# Patient Record
Sex: Female | Born: 1955 | Race: White | Hispanic: No | Marital: Married | Smoking: Former smoker
Health system: Southern US, Community
[De-identification: ages and names within clinical notes are randomized; demographics above are authoritative.]

## PROBLEM LIST (undated history)

## (undated) DIAGNOSIS — E039 Hypothyroidism, unspecified: Secondary | ICD-10-CM

## (undated) DIAGNOSIS — I10 Essential (primary) hypertension: Secondary | ICD-10-CM

## (undated) HISTORY — PX: DENTAL SURGERY: SHX609

---

## 2017-10-05 ENCOUNTER — Emergency Department (HOSPITAL_BASED_OUTPATIENT_CLINIC_OR_DEPARTMENT_OTHER): Payer: 59

## 2017-10-05 ENCOUNTER — Encounter (HOSPITAL_BASED_OUTPATIENT_CLINIC_OR_DEPARTMENT_OTHER): Payer: Self-pay | Admitting: *Deleted

## 2017-10-05 ENCOUNTER — Observation Stay (HOSPITAL_BASED_OUTPATIENT_CLINIC_OR_DEPARTMENT_OTHER)
Admission: EM | Admit: 2017-10-05 | Discharge: 2017-10-06 | Disposition: A | Discharge status: Home / Self Care | Payer: 59 | Attending: Internal Medicine | Admitting: Internal Medicine

## 2017-10-05 DIAGNOSIS — E876 Hypokalemia: Secondary | ICD-10-CM | POA: Diagnosis present

## 2017-10-05 DIAGNOSIS — Z79899 Other long term (current) drug therapy: Secondary | ICD-10-CM | POA: Insufficient documentation

## 2017-10-05 DIAGNOSIS — G459 Transient cerebral ischemic attack, unspecified: Secondary | ICD-10-CM | POA: Diagnosis not present

## 2017-10-05 DIAGNOSIS — I10 Essential (primary) hypertension: Secondary | ICD-10-CM | POA: Insufficient documentation

## 2017-10-05 DIAGNOSIS — E039 Hypothyroidism, unspecified: Secondary | ICD-10-CM | POA: Diagnosis not present

## 2017-10-05 DIAGNOSIS — Z87891 Personal history of nicotine dependence: Secondary | ICD-10-CM | POA: Insufficient documentation

## 2017-10-05 DIAGNOSIS — R42 Dizziness and giddiness: Secondary | ICD-10-CM | POA: Diagnosis present

## 2017-10-05 DIAGNOSIS — H538 Other visual disturbances: Secondary | ICD-10-CM | POA: Diagnosis present

## 2017-10-05 HISTORY — DX: Hypothyroidism, unspecified: E03.9

## 2017-10-05 HISTORY — DX: Essential (primary) hypertension: I10

## 2017-10-05 LAB — CBC WITH DIFFERENTIAL/PLATELET
Basophils Absolute: 0.1 10*3/uL (ref 0.0–0.1)
Basophils Relative: 1 %
Eosinophils Absolute: 0.1 10*3/uL (ref 0.0–0.7)
Eosinophils Relative: 1 %
HEMATOCRIT: 41.2 % (ref 36.0–46.0)
Hemoglobin: 14.1 g/dL (ref 12.0–15.0)
LYMPHS ABS: 1.9 10*3/uL (ref 0.7–4.0)
LYMPHS PCT: 24 %
MCH: 31.5 pg (ref 26.0–34.0)
MCHC: 34.2 g/dL (ref 30.0–36.0)
MCV: 92.2 fL (ref 78.0–100.0)
MONO ABS: 0.5 10*3/uL (ref 0.1–1.0)
MONOS PCT: 7 %
NEUTROS ABS: 5.3 10*3/uL (ref 1.7–7.7)
Neutrophils Relative %: 67 %
Platelets: 287 10*3/uL (ref 150–400)
RBC: 4.47 MIL/uL (ref 3.87–5.11)
RDW: 12.1 % (ref 11.5–15.5)
WBC: 7.9 10*3/uL (ref 4.0–10.5)

## 2017-10-05 LAB — COMPREHENSIVE METABOLIC PANEL
ALT: 11 U/L — ABNORMAL LOW (ref 14–54)
ANION GAP: 9 (ref 5–15)
AST: 19 U/L (ref 15–41)
Albumin: 4.3 g/dL (ref 3.5–5.0)
Alkaline Phosphatase: 100 U/L (ref 38–126)
BILIRUBIN TOTAL: 0.6 mg/dL (ref 0.3–1.2)
BUN: 12 mg/dL (ref 6–20)
CO2: 25 mmol/L (ref 22–32)
Calcium: 9 mg/dL (ref 8.9–10.3)
Chloride: 105 mmol/L (ref 101–111)
Creatinine, Ser: 0.7 mg/dL (ref 0.44–1.00)
GFR calc Af Amer: >60 mL/min (ref >60)
GLUCOSE: 100 mg/dL — AB (ref 65–99)
POTASSIUM: 3.3 mmol/L — AB (ref 3.5–5.1)
Sodium: 139 mmol/L (ref 135–145)
TOTAL PROTEIN: 7.4 g/dL (ref 6.5–8.1)

## 2017-10-05 LAB — TROPONIN I: Troponin I: <0.03 ng/mL (ref <0.03)

## 2017-10-05 LAB — D-DIMER, QUANTITATIVE: D-Dimer, Quant: 0.35 ug/mL-FEU (ref 0.00–0.50)

## 2017-10-05 MED ORDER — IBUPROFEN 800 MG PO TABS
800.0000 mg | ORAL_TABLET | Freq: Once | ORAL | Status: AC
  Administered 2017-10-05: 800 mg via ORAL
  Filled 2017-10-05: qty 1

## 2017-10-05 NOTE — ED Notes (Signed)
Pt laying flat for orthostatic vitals.  

## 2017-10-05 NOTE — ED Notes (Signed)
ED Provider at bedside. 

## 2017-10-05 NOTE — Progress Notes (Addendum)
This is a no charge note  Pending admission per Dr.   Atlee [facility]ansfer [facility] [place]  61 year old-year-old lady with past medical history for hypothyroidism, newly diagnosed hypertension 3 days ago, who presents with blurry vision and dizziness.  Patient has newly diagnosed hypertension 3 days ago. She was started with amlodipine yesterday. Patient had dizziness and 5 minutes of blurry vision while watching TV. Her blurry vision has resolved. Orthostatic vital sign is negative. Patient also had one episode of right shoulder pain and palpitation.  Patient was found to have negative d-dimer, negative troponin, WBC 7.9, potassium 3.3, creatinine normal, temperature normal, mild bradycardia, oxygen saturation 100% on room air. CT head is negative for acute intracranial abnormalities. Clinically patient is suspected to have TIA. Patient is placed on telemetry bed for observation.  Please call manager of [facility] at [phone] when pt arrives to floor   Lorretta HarpXilin Jerolyn Flenniken, MD  [facility] Pager [phone]  If 7PM-7AM, please contact night-coverage [email or web address] Password [email or web address] 10/05/2017, 11:54 PM

## 2017-10-05 NOTE — ED Provider Notes (Signed)
[facility] EMERGENCY DEPARTMENT Provider Note   CSN: [id number] Arrival date & time: 10/05/17  1955     History   Chief Complaint Chief Complaint  Patient presents with  . Dizziness    HPI   Pulse 65, temperature 98.5 F (36.9 C), resp. rate 16, height 5\' 2"  (1.575 m), weight 59 kg (130 lb), SpO2 100 %.  Alexa Moore is a 61 y.o. female complaining of blurred vision for roughly 5 minutes while walking TV watching earlier in the evening.  Symptoms resolved, there was no other symptoms including nausea, vomiting, dysarthria, unilateral weakness, ataxia.  She states that she has been having what she describes as a crick in her right shoulder, has been radiating down to the right arm.  There is been no anterior chest pain associated with this, she sought care for it at urgent care yesterday and it was noted that her blood pressure was high, she was started on 5 mg of Norvasc of which she has had 1 dose.  She was also started on a daily low-dose aspirin.  She is never been diagnosed with high blood pressure in the past, her primary care who used to follow her retired pressure has been borderline in the past.  This patient is right-hand dominant, she did work at [employer] recently.  Notes that she has had some shortness of breath and palpitations over the last several weeks.  She denies any history of DVT/PE, recent immobilizations, calf pain, leg swelling, exogenous estrogen.  She feels generally fatigued and dizzy, the dizziness is not necessarily come with standing.   Past Medical History:  Diagnosis Date  . Hypertension   . Hypothyroidism     Patient Active Problem List   Diagnosis Date Noted  . TIA (transient ischemic attack) 10/05/2017  . Blurry vision 10/05/2017  . Hypokalemia 10/05/2017  . Hypertension   . Hypothyroidism     History reviewed. No pertinent surgical history.  OB History    No data available       Home Medications    Prior  to Admission medications   Medication Sig Start Date End Date Taking? Authorizing Provider  amLODipine (NORVASC) 5 MG tablet Take 5 mg by mouth daily.   Yes [provider]  levothyroxine (SYNTHROID, LEVOTHROID) 125 MCG tablet Take 125 mcg by mouth daily before breakfast.   Yes [provider]    Family History No family history on file.  Social History Social History  Substance Use Topics  . Smoking status: Former Games developer  . Smokeless tobacco: Not on file  . Alcohol use No     Allergies   Patient has no known allergies.   Review of Systems Review of Systems  A complete review of systems was obtained and all systems are negative except as noted in the HPI and PMH.   Physical Exam Updated Vital Signs BP (!) 173/92   Pulse (!) 49   Temp 98.5 F (36.9 C)   Resp 10   Ht 5\' 2"  (1.575 m)   Wt 59 kg (130 lb)   SpO2 98%   BMI 23.78 kg/m   Physical Exam  Constitutional: She is oriented to person, place, and time. She appears well-developed and well-nourished. No distress.  HENT:  Head: Normocephalic and atraumatic.  Mouth/Throat: Oropharynx is clear and moist.  Eyes: Pupils are equal, round, and reactive to light. Conjunctivae and EOM are normal.  No TTP of maxillary or frontal sinuses  No TTP or  induration of temporal arteries bilaterally  Neck: Normal range of motion. Neck supple. No JVD present. No tracheal deviation present.  FROM to C-spine. Pt can touch chin to chest without discomfort. No TTP of midline cervical spine.   Cardiovascular: Normal rate, regular rhythm and intact distal pulses.   Radial pulse equal bilaterally  Pulmonary/Chest: Effort normal and breath sounds normal. No stridor. No respiratory distress. She has no wheezes. She has no rales. She exhibits no tenderness.  Abdominal: Soft. Bowel sounds are normal. She exhibits no distension and no mass. There is no tenderness. There is no rebound and no guarding.  Musculoskeletal: Normal  range of motion. She exhibits no edema or tenderness.  No calf asymmetry, superficial collaterals, palpable cords, edema, Homans sign negative bilaterally.    Neurological: She is alert and oriented to person, place, and time. No cranial nerve deficit.  II-Visual fields grossly intact. III/IV/VI-Extraocular movements intact.  Pupils reactive bilaterally. V/VII-Smile symmetric, equal eyebrow raise,  facial sensation intact VIII- Hearing grossly intact IX/X-Normal gag XI-bilateral shoulder shrug XII-midline tongue extension Motor: 5/5 bilaterally with normal tone and bulk Cerebellar: Normal finger-to-nose  and normal heel-to-shin test.   Romberg negative Ambulates with a coordinated gait   Skin: Skin is warm. She is not diaphoretic.  Psychiatric: She has a normal mood and affect.  Nursing note and vitals reviewed.    ED Treatments / Results  Labs (all labs ordered are listed, but only abnormal results are displayed) Labs Reviewed  COMPREHENSIVE METABOLIC PANEL - Abnormal; Notable for the following:       Result Value   Potassium 3.3 (*)    Glucose, Bld 100 (*)    ALT 11 (*)    All other components within normal limits  CBC WITH DIFFERENTIAL/PLATELET  TROPONIN I  D-DIMER, QUANTITATIVE (NOT AT [facility])    EKG  EKG Interpretation  Date/Time:  Monday October 05 2017 21:48:39 EDT Ventricular Rate:  58 PR Interval:    QRS Duration: 89 QT Interval:  382 QTC Calculation: 376 R Axis:   34 Text Interpretation:  Sinus rhythm RSR' in V1 or V2, probably normal variant Borderline T wave abnormalities No significant change since last tracing Confirmed by Vanetta MuldersZackowski, Scott [id number]) on 10/05/2017 9:51:11 PM       Radiology Ct Head Wo Contrast  Result Date: 10/05/2017 CLINICAL DATA:  61 y/o  F; nausea and vision changes. EXAM: CT HEAD WITHOUT CONTRAST TECHNIQUE: Contiguous axial images were obtained from the base of the skull through the vertex without intravenous contrast. COMPARISON:   None. FINDINGS: Brain: No evidence of acute infarction, hemorrhage, hydrocephalus, extra-axial collection or mass lesion/mass effect. Vascular: Calcific atherosclerosis of carotid siphons. Skull: Normal. Negative for fracture or focal lesion. Sinuses/Orbits: Left maxillary sinus mucous retention cyst. Otherwise negative. Other: None. IMPRESSION: No acute intracranial abnormality identified. Unremarkable CT of the head. Electronically Signed   By: Mitzi HansenLance  Furusawa-Stratton M.D.   On: 10/05/2017 22:35    Procedures Procedures (including critical care time)  Medications Ordered in ED Medications  ibuprofen (ADVIL,MOTRIN) tablet 800 mg (800 mg Oral Given 10/05/17 2227)     Initial Impression / Assessment and Plan / ED Course  I have reviewed the triage vital signs and the nursing notes.  Pertinent labs & imaging results that were available during my care of the patient were reviewed by me and considered in my medical decision making (see chart for details).     Vitals:   10/05/17 2200 10/05/17 2324 10/06/17 0000 10/06/17 0101  BP: (!) 162/87 (!) 157/90 (!) 172/87 (!) 173/92  Pulse: (!) 58 (!) 56 (!) 56 (!) 49  Resp: 20 17 15 10   Temp:      SpO2: 99% 100% 98% 98%  Weight:      Height:        Medications  ibuprofen (ADVIL,MOTRIN) tablet 800 mg (800 mg Oral Given 10/05/17 2227)    Alexa Moore is 61 y.o. female presenting with right posterior shoulder pain radiating down to the right arm she is also had some palpitations and shortness of breath with dizziness.  Physical exam is not consistent with DVT, no risk factors for PE, patient is not tachypneic or tachycardic.  She is a former smoker.  Of more concern she had a blurred vision lasting for approximately 5 minutes while watching TV earlier in the evening.  Concern for TIA given no nonfocal neurologic exam.  EKG with no acute findings, troponin and d-dimer blood work and CT head negative, will need admission for TIA.     Final  Clinical Impressions(s) / ED Diagnoses   Final diagnoses:  Essential hypertension  Hypothyroidism, unspecified type  TIA (transient ischemic attack)    New Prescriptions New Prescriptions   No medications on file     Kaylyn Lim 10/06/17 0147    Vanetta Mulders, MD 10/10/17 [phone]

## 2017-10-05 NOTE — ED Triage Notes (Signed)
Pt c/o dizziness and blurred vision , Dx hypertension x 3 days ago , started on BP medication

## 2017-10-05 NOTE — ED Notes (Signed)
Pt to CT, will lay pt flat and complete orthostatics when she returns

## 2017-10-06 ENCOUNTER — Encounter (HOSPITAL_COMMUNITY): Payer: Self-pay | Admitting: Internal Medicine

## 2017-10-06 ENCOUNTER — Observation Stay (HOSPITAL_BASED_OUTPATIENT_CLINIC_OR_DEPARTMENT_OTHER): Payer: 59

## 2017-10-06 ENCOUNTER — Observation Stay (HOSPITAL_COMMUNITY): Payer: 59

## 2017-10-06 DIAGNOSIS — E876 Hypokalemia: Secondary | ICD-10-CM

## 2017-10-06 DIAGNOSIS — E039 Hypothyroidism, unspecified: Secondary | ICD-10-CM | POA: Diagnosis not present

## 2017-10-06 DIAGNOSIS — I10 Essential (primary) hypertension: Secondary | ICD-10-CM

## 2017-10-06 DIAGNOSIS — H538 Other visual disturbances: Secondary | ICD-10-CM

## 2017-10-06 LAB — LIPID PANEL
Cholesterol: 218 mg/dL — ABNORMAL HIGH (ref 0–200)
HDL: 53 mg/dL (ref >40)
LDL CALC: 138 mg/dL — AB (ref 0–99)
TRIGLYCERIDES: 135 mg/dL (ref <150)
Total CHOL/HDL Ratio: 4.1 RATIO
VLDL: 27 mg/dL (ref 0–40)

## 2017-10-06 LAB — HEMOGLOBIN A1C
Hgb A1c MFr Bld: 5.2 % (ref 4.8–5.6)
Mean Plasma Glucose: 102.54 mg/dL

## 2017-10-06 LAB — ECHOCARDIOGRAM COMPLETE
Height: 62 in
WEIGHTICAEL: 2182.4 [oz_av]

## 2017-10-06 LAB — BASIC METABOLIC PANEL
ANION GAP: 9 (ref 5–15)
BUN: 7 mg/dL (ref 6–20)
CHLORIDE: 106 mmol/L (ref 101–111)
CO2: 26 mmol/L (ref 22–32)
Calcium: 9.3 mg/dL (ref 8.9–10.3)
Creatinine, Ser: 0.75 mg/dL (ref 0.44–1.00)
GFR calc non Af Amer: >60 mL/min (ref >60)
GLUCOSE: 92 mg/dL (ref 65–99)
Potassium: 3.8 mmol/L (ref 3.5–5.1)
Sodium: 141 mmol/L (ref 135–145)

## 2017-10-06 LAB — HIV ANTIBODY (ROUTINE TESTING W REFLEX): HIV SCREEN 4TH GENERATION: NONREACTIVE

## 2017-10-06 LAB — T4, FREE: Free T4: 0.92 ng/dL (ref 0.61–1.12)

## 2017-10-06 LAB — PROTIME-INR
INR: 0.93
Prothrombin Time: 12.4 seconds (ref 11.4–15.2)

## 2017-10-06 LAB — MAGNESIUM: MAGNESIUM: 2.3 mg/dL (ref 1.7–2.4)

## 2017-10-06 LAB — TSH: TSH: 7.591 u[IU]/mL — AB (ref 0.350–4.500)

## 2017-10-06 MED ORDER — POTASSIUM CHLORIDE 20 MEQ/15ML (10%) PO SOLN
20.0000 meq | Freq: Once | ORAL | Status: AC
Start: 2017-10-06 — End: 2017-10-06
  Administered 2017-10-06: 20 meq via ORAL
  Filled 2017-10-06: qty 15

## 2017-10-06 MED ORDER — ACETAMINOPHEN 160 MG/5ML PO SOLN: 650.0000 mg | ORAL | Status: DC | PRN

## 2017-10-06 MED ORDER — SODIUM CHLORIDE 0.9 % IV SOLN
INTRAVENOUS | Status: DC
  Administered 2017-10-06: 04:00:00 via INTRAVENOUS

## 2017-10-06 MED ORDER — HYDRALAZINE HCL 20 MG/ML IJ SOLN: 5.0000 mg | INTRAMUSCULAR | Status: DC | PRN

## 2017-10-06 MED ORDER — ACETAMINOPHEN 650 MG RE SUPP: 650.0000 mg | RECTAL | Status: DC | PRN

## 2017-10-06 MED ORDER — LEVOTHYROXINE SODIUM 100 MCG PO TABS
200.0000 ug | ORAL_TABLET | Freq: Every day | ORAL | Status: DC
  Administered 2017-10-06: 200 ug via ORAL
  Filled 2017-10-06: qty 2

## 2017-10-06 MED ORDER — ZOLPIDEM TARTRATE 5 MG PO TABS: 5.0000 mg | ORAL_TABLET | Freq: Every evening | ORAL | Status: DC | PRN

## 2017-10-06 MED ORDER — SENNOSIDES-DOCUSATE SODIUM 8.6-50 MG PO TABS: 1.0000 | ORAL_TABLET | Freq: Every evening | ORAL | Status: DC | PRN

## 2017-10-06 MED ORDER — AMLODIPINE BESYLATE 10 MG PO TABS
10.0000 mg | ORAL_TABLET | Freq: Every day | ORAL | Status: DC
  Administered 2017-10-06: 10 mg via ORAL
  Filled 2017-10-06: qty 1

## 2017-10-06 MED ORDER — ONDANSETRON HCL 4 MG/2ML IJ SOLN: 4.0000 mg | Freq: Three times a day (TID) | INTRAMUSCULAR | Status: DC | PRN

## 2017-10-06 MED ORDER — ACETAMINOPHEN 325 MG PO TABS
650.0000 mg | ORAL_TABLET | ORAL | Status: DC | PRN
  Administered 2017-10-06: 650 mg via ORAL
  Filled 2017-10-06: qty 2

## 2017-10-06 MED ORDER — AMLODIPINE BESYLATE 5 MG PO TABS: 10.0000 mg | ORAL_TABLET | Freq: Every day | ORAL | 0 refills | Status: DC

## 2017-10-06 MED ORDER — SIMVASTATIN 20 MG PO TABS: 20.0000 mg | ORAL_TABLET | Freq: Every day | ORAL | 0 refills | Status: DC

## 2017-10-06 MED ORDER — ENOXAPARIN SODIUM 40 MG/0.4ML ~~LOC~~ SOLN
40.0000 mg | SUBCUTANEOUS | Status: DC
  Administered 2017-10-06: 40 mg via SUBCUTANEOUS
  Filled 2017-10-06: qty 0.4

## 2017-10-06 MED ORDER — AMLODIPINE BESYLATE 5 MG PO TABS: 5.0000 mg | ORAL_TABLET | Freq: Two times a day (BID) | ORAL | Status: DC

## 2017-10-06 MED ORDER — SIMVASTATIN 20 MG PO TABS: 20.0000 mg | ORAL_TABLET | Freq: Every day | ORAL | Status: DC

## 2017-10-06 MED ORDER — ASPIRIN 325 MG PO TABS: 325.0000 mg | ORAL_TABLET | Freq: Every day | ORAL | 0 refills | Status: DC

## 2017-10-06 MED ORDER — ASPIRIN 300 MG RE SUPP: 300.0000 mg | Freq: Every day | RECTAL | Status: DC

## 2017-10-06 MED ORDER — INFLUENZA VAC SPLIT QUAD 0.5 ML IM SUSY: 0.5000 mL | PREFILLED_SYRINGE | INTRAMUSCULAR | Status: DC

## 2017-10-06 MED ORDER — STROKE: EARLY STAGES OF RECOVERY BOOK
Freq: Once | Status: AC
  Administered 2017-10-06: 05:00:00
  Filled 2017-10-06: qty 1

## 2017-10-06 MED ORDER — AMLODIPINE BESYLATE 5 MG PO TABS: 5.0000 mg | ORAL_TABLET | Freq: Every day | ORAL | Status: DC

## 2017-10-06 MED ORDER — ASPIRIN 325 MG PO TABS
325.0000 mg | ORAL_TABLET | Freq: Every day | ORAL | Status: DC
  Administered 2017-10-06: 325 mg via ORAL
  Filled 2017-10-06: qty 1

## 2017-10-06 NOTE — Discharge Summary (Signed)
Physician Discharge Summary  Brain Alexa Moore [id number] DOB: September 04, 1956 DOA: 10/05/2017  PCP: System, Pcp Not In  Admit date: 10/05/2017 Discharge date: 10/06/2017  Admitted From: Home  Disposition:  Home   Recommendations for Outpatient Follow-up:  1. Follow up with PCP in 1-2 weeks 2. Please obtain BMP/CBC in one week 3. Please follow up on the following pending results: please follow [facility] T 3 results.  4. Needs referral to opthalmology 5. Need BP controlled.      Discharge Condition: stable.  CODE STATUS: Full code.  Diet recommendation: Heart Healthy  Brief/Interim Summary:   1-TIA Vs malignant hypertension;  Patient present with blurry vision, double vision for 5 minutes. Also she had nausea and feeling foggy that day.  MRI negative for stroke.  Discussed with neurology, BP controlled. Continue with aspirin.  LDL 138, started on cholesterol medications.  ECHO normal. Carotid doppler, normal/  HbA1c 5.2  2-HTN; Norvasc increase to 10 mg daily.   3-hyperlipidemia; started on statins.   4-hypothyroidism; TSH elevated. Free t 3 pending. She forget to takes her synthroid on regular bases.  Continue with synthroid.      Discharge Diagnoses:  Principal Problem:   Blurry vision Active Problems:   TIA (transient ischemic attack)   Hypertension   Hypothyroidism   Hypokalemia    Discharge Instructions  Discharge Instructions    Diet - low sodium heart healthy    Complete by:  As directed    Increase activity slowly    Complete by:  As directed      Allergies as of 10/06/2017   No Known Allergies     Medication List    STOP taking these medications   [employer] ASPIRIN ADULT LOW DOSE 81 MG chewable tablet Generic drug:  aspirin Replaced by:  aspirin 325 MG tablet     TAKE these medications   amLODipine 5 MG tablet Commonly known as:  NORVASC Take 2 tablets (10 mg total) by mouth daily. What changed:  how much to take   aspirin 325 MG tablet Take  1 tablet (325 mg total) by mouth daily. Replaces:  [employer] ASPIRIN ADULT LOW DOSE 81 MG chewable tablet   levothyroxine 200 MCG tablet Commonly known as:  SYNTHROID, LEVOTHROID Take 200 mcg by mouth daily.   simvastatin 20 MG tablet Commonly known as:  ZOCOR Take 1 tablet (20 mg total) by mouth daily at 6 PM.       No Known Allergies  Consultations:  Neurology over phone    Procedures/Studies: Ct Head Wo Contrast  Result Date: 10/05/2017 CLINICAL DATA:  61 y/o  F; nausea and vision changes. EXAM: CT HEAD WITHOUT CONTRAST TECHNIQUE: Contiguous axial images were obtained from the base of the skull through the vertex without intravenous contrast. COMPARISON:  None. FINDINGS: Brain: No evidence of acute infarction, hemorrhage, hydrocephalus, extra-axial collection or mass lesion/mass effect. Vascular: Calcific atherosclerosis of carotid siphons. Skull: Normal. Negative for fracture or focal lesion. Sinuses/Orbits: Left maxillary sinus mucous retention cyst. Otherwise negative. Other: None. IMPRESSION: No acute intracranial abnormality identified. Unremarkable CT of the head. Electronically Signed   By: Mitzi HansenLance  Furusawa-Stratton M.D.   On: 10/05/2017 22:35   Mr Brain Wo Contrast  Result Date: 10/06/2017 CLINICAL DATA:  Acute presentation with blurred vision and diplopia 3 days ago. EXAM: MRI HEAD WITHOUT CONTRAST MRA HEAD WITHOUT CONTRAST TECHNIQUE: Multiplanar, multiecho pulse sequences of the brain and surrounding structures were obtained without intravenous contrast. Angiographic images of the head were obtained using MRA technique  without contrast. COMPARISON:  CT 10/05/2017 FINDINGS: MRI HEAD FINDINGS Brain: Diffusion imaging does not show any acute or subacute infarction. The brain has normal appearance without evidence of atrophy, old small or large vessel infarction, mass lesion, hemorrhage, hydrocephalus or extra-axial collection. Vascular: Major vessels at the base of the brain show  flow. Skull and upper cervical spine: Negative. Artifact related to dental work. Sinuses/Orbits: No significant sinus disease. Left maxillary sinus retention cyst. Orbits negative as seen. Other: None MRA HEAD FINDINGS both internal carotid arteries are widely patent into the brain. The anterior and middle cerebral vessels are patent without proximal stenosis, aneurysm or vascular malformation. Fetal origin of both posterior cerebral arteries. Both vertebral arteries are patent to the basilar. No basilar stenosis. Prominent proximal basilar fenestration. Diminutive basilar artery because of the fetal origin of both posterior cerebral arteries. No acquired stenosis. IMPRESSION: Normal MRI of the brain for a person of this age. No evidence of acquired intracranial large or medium vessel disease. 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 Electronically Signed   By: Paulina Fusi M.D.   On: 10/06/2017 08:11   Mr Shirlee Latch ZO Contrast  Result Date: 10/06/2017 CLINICAL DATA:  Acute presentation with blurred vision and diplopia 3 days ago. EXAM: MRI HEAD WITHOUT CONTRAST MRA HEAD WITHOUT CONTRAST TECHNIQUE: Multiplanar, multiecho pulse sequences of the brain and surrounding structures were obtained without intravenous contrast. Angiographic images of the head were obtained using MRA technique without contrast. COMPARISON:  CT 10/05/2017 FINDINGS: MRI HEAD FINDINGS Brain: Diffusion imaging does not show any acute or subacute infarction. The brain has normal appearance without evidence of atrophy, old small or large vessel infarction, mass lesion, hemorrhage, hydrocephalus or extra-axial collection. Vascular: Major vessels at the base of the brain show flow. Skull and upper cervical spine: Negative. Artifact related to dental work. Sinuses/Orbits: No significant sinus disease. Left maxillary sinus  retention cyst. Orbits negative as seen. Other: None MRA HEAD FINDINGS both internal carotid arteries are widely patent into the brain. The anterior and middle cerebral vessels are patent without proximal stenosis, aneurysm or vascular malformation. Fetal origin of both posterior cerebral arteries. Both vertebral arteries are patent to the basilar. No basilar stenosis. Prominent proximal basilar fenestration. Diminutive basilar artery because of the fetal origin of both posterior cerebral arteries. No acquired stenosis. IMPRESSION: Normal MRI of the brain for a person of this age. No evidence of acquired intracranial large or medium vessel disease. 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 Electronically Signed   By: Paulina Fusi M.D.   On: 10/06/2017 08:11      Subjective: She is feeling well, tolerated BP medications.  Denies blurry vision.   Discharge Exam: Vitals:   10/06/17 0625 10/06/17 1427  BP: (!) 175/71 129/72  Pulse: (!) 50 63  Resp: 16 17  Temp: 98.6 F (37 C) 98.5 F (36.9 C)  SpO2: 95% 98%   Vitals:   10/06/17 0330 10/06/17 0530 10/06/17 0625 10/06/17 1427  BP: (!) 174/82 (!) 151/82 (!) 175/71 129/72  Pulse: (!) 52 (!) 52 (!) 50 63  Resp: 16 16 16 17   Temp: 98.6 F (37 C) 98.6 F (37 C) 98.6 F (37 C) 98.5 F (36.9 C)  TempSrc: Oral Oral Oral Oral  SpO2: 99% 94% 95% 98%  Weight: 61.9 kg (136 lb 6.4 oz)  Height: 5\' 2"  (1.575 m)       General: Pt is alert, awake, not in acute distress Cardiovascular: RRR, S1/S2 +, no rubs, no gallops Respiratory: CTA bilaterally, no wheezing, no rhonchi Abdominal: Soft, NT, ND, bowel sounds + Extremities: no edema, no cyanosis    The results of significant diagnostics from this hospitalization (including imaging, microbiology, ancillary and laboratory) are listed below for reference.      Microbiology: No results found for this or any previous visit (from the past 240 hour(s)).   Labs: BNP (last 3 results) No results for input(s): BNP in the last 8760 hours. Basic Metabolic Panel:  Recent Labs Lab 10/05/17 2025 10/06/17 0411 10/06/17 0933  NA 139  --  141  K 3.3*  --  3.8  CL 105  --  106  CO2 25  --  26  GLUCOSE 100*  --  92  BUN 12  --  7  CREATININE 0.70  --  0.75  CALCIUM 9.0  --  9.3  MG  --  2.3  --    Liver Function Tests:  Recent Labs Lab 10/05/17 2025  AST 19  ALT 11*  ALKPHOS 100  BILITOT 0.6  PROT 7.4  ALBUMIN 4.3   No results for input(s): LIPASE, AMYLASE in the last 168 hours. No results for input(s): AMMONIA in the last 168 hours. CBC:  Recent Labs Lab 10/05/17 2025  WBC 7.9  NEUTROABS 5.3  HGB 14.1  HCT 41.2  MCV 92.2  PLT 287   Cardiac Enzymes:  Recent Labs Lab 10/05/17 2025  TROPONINI <0.03   BNP: Invalid input(s): POCBNP CBG: No results for input(s): GLUCAP in the last 168 hours. D-Dimer  Recent Labs  10/05/17 2025  DDIMER 0.35   Hgb A1c  Recent Labs  10/06/17 0411  HGBA1C 5.2   Lipid Profile  Recent Labs  10/06/17 0411  CHOL 218*  HDL 53  LDLCALC 138*  TRIG 135  CHOLHDL 4.1   Thyroid function studies  Recent Labs  10/06/17 0411  TSH 7.591*   Anemia work up No results for input(s): VITAMINB12, FOLATE, FERRITIN, TIBC, IRON, RETICCTPCT in the last 72 hours. Urinalysis No results found for: COLORURINE, APPEARANCEUR, LABSPEC, PHURINE, GLUCOSEU, HGBUR, BILIRUBINUR, KETONESUR, PROTEINUR, UROBILINOGEN, NITRITE, LEUKOCYTESUR Sepsis Labs Invalid input(s): PROCALCITONIN,  WBC,  LACTICIDVEN Microbiology No results found for this or any previous visit (from the past 240 hour(s)).   Time coordinating discharge: Over 30 minutes  SIGNED:   Alba Cory, MD  [facility] 10/06/2017, 5:08 PM Pager   If 7PM-7AM, please contact night-coverage [email or web address] Password [email or web address]

## 2017-10-06 NOTE — Progress Notes (Signed)
OT Screen    10/06/17 1000  OT Visit Information  Last OT Received On 10/06/17  Reason Eval/Treat Not Completed OT screened, no needs identified, will sign off (Pt and husband reporting that pt's blurry vision has resolved and she is at baseline for ADLs and IADLs. Pt reporting pain in R shoulder but states that is no numbness or weakness in her R hand. Discussed reporting pain to MD.)   Curlene Dolphinharis Hailee Hollick MSOT, OTR/L Acute Rehab Pager: [phone] Office: [phone]

## 2017-10-06 NOTE — Progress Notes (Signed)
SLP Cancellation Note  Patient Details Name: Alexa Moore MRN: [id number] DOB: 08-04-1956   Cancelled treatment:       Reason Eval/Treat Not Completed: SLP screened, no needs identified, will sign off   Alexa Moore, Alexa Moore 10/06/2017, 9:36 AM

## 2017-10-06 NOTE — Discharge Instructions (Signed)
Please follow up with PCP in 1 week.  You need to follow up with opthalmology,  Please take BP medication, aspirin and cholesterol medication.

## 2017-10-06 NOTE — Evaluation (Signed)
Physical Therapy Evaluation Patient Details Name: Alexa HiltsKristin Hunley MRN: [id number] DOB: [birth year] Today's Date: 10/06/2017   History of Present Illness  61 y.o. female with medical history significant of hypothyroidism, newly diagnosed hypertension 3 days ago, who presents with blurry vision and double vision  Clinical Impression  Orders received for PT evaluation. Patient demonstrates no significant deficits in functional mobility. Patient independent with all activity and balance. No further skilled PT needs. Will sign off.   Follow Up Recommendations No PT follow up    Equipment Recommendations  None recommended by PT    Recommendations for Other Services       Precautions / Restrictions        Mobility  Bed Mobility Overal bed mobility: Independent                Transfers Overall transfer level: Independent                  Ambulation/Gait Ambulation/Gait assistance: Independent Ambulation Distance (Feet): 440 Feet Assistive device: None Gait Pattern/deviations: WFL(Within Functional Limits)        Stairs            Wheelchair Mobility    Modified Rankin (Stroke Patients Only)       Balance Overall balance assessment: Independent (Independent with Higher level balance)                               Standardized Balance Assessment Standardized Balance Assessment : Dynamic Gait Index   Dynamic Gait Index Level Surface: Normal Change in Gait Speed: Normal Gait with Horizontal Head Turns: Normal Gait with Vertical Head Turns: Normal Gait and Pivot Turn: Normal Step Over Obstacle: Normal Step Around Obstacles: Normal Steps: Normal Total Score: 24       Pertinent Vitals/Pain Pain Assessment: Faces Faces Pain Scale: Hurts little more Pain Location: right shoulder Pain Descriptors / Indicators: Sore Pain Intervention(s): Monitored during session    Home Living Family/patient expects to be discharged to:: Private  residence Living Arrangements: Spouse/significant other Available Help at Discharge: Family Type of Home: House Home Access: Stairs to enter Entrance Stairs-Rails: Can reach both Secretary/administratorntrance Stairs-Number of Steps: 5 Home Layout: Two level Home Equipment: None      Prior Function Level of Independence: Independent               Hand Dominance   Dominant Hand: Right    Extremity/Trunk Assessment   Upper Extremity Assessment Upper Extremity Assessment: Defer to OT evaluation    Lower Extremity Assessment Lower Extremity Assessment: Overall WFL for tasks assessed       Communication   Communication:  (wears glasses all the time)  Cognition Arousal/Alertness: Awake/alert Behavior During Therapy: WFL for tasks assessed/performed Overall Cognitive Status: Within Functional Limits for tasks assessed                                        General Comments      Exercises     Assessment/Plan    PT Assessment Patent does not need any further PT services  PT Problem List         PT Treatment Interventions      PT Goals (Current goals can be found in the Care Plan section)  Acute Rehab PT Goals PT Goal Formulation: All assessment and education complete, DC therapy  Frequency     Barriers to discharge        Co-evaluation               AM-PAC PT "6 Clicks" Daily Activity  Outcome Measure Difficulty turning over in bed (including adjusting bedclothes, sheets and blankets)?: None Difficulty moving from lying on back to sitting on the side of the bed? : None Difficulty sitting down on and standing up from a chair with arms (e.g., wheelchair, bedside commode, etc,.)?: None Help needed moving to and from a bed to chair (including a wheelchair)?: None Help needed walking in hospital room?: None Help needed climbing 3-5 steps with a railing? : None 6 Click Score: 24    End of Session Equipment Utilized During Treatment: Gait  belt Activity Tolerance: Patient tolerated treatment well Patient left: in bed Nurse Communication: Mobility status PT Visit Diagnosis: Other symptoms and signs involving the nervous system (R29.898)    Time: 1610-9604 PT Time Calculation (min) (ACUTE ONLY): 17 min   Charges:   PT Evaluation $PT Eval Low Complexity: 1 Low     PT G Codes:   PT G-Codes **NOT FOR INPATIENT CLASS** Functional Assessment Tool Used: Clinical judgement Functional Limitation: Mobility: Walking and moving around Mobility: Walking and Moving Around Current Status (V4098): 0 percent impaired, limited or restricted Mobility: Walking and Moving Around Goal Status (J1914): 0 percent impaired, limited or restricted Mobility: Walking and Moving Around Discharge Status (N8295): 0 percent impaired, limited or restricted    Charlotte Crumb, PT DPT  Board Certified Neurologic Specialist [phone]   Fabio Asa 10/06/2017, 9:15 AM

## 2017-10-06 NOTE — H&P (Signed)
History and Physical    Alexa Moore ZOX:[id number] DOB: [birth year] DOA: 10/05/2017  Referring MD/NP/PA:   PCP: System, Pcp Not In   Patient coming from:  The patient is coming from home.  At baseline, pt is independent for most of ADL.    Chief Complaint: blurry vision and double vision  HPI: Alexa Moore is a 61 y.o. female with medical history significant of hypothyroidism, newly diagnosed hypertension 3 days ago, who presents with blurry vision and double vision.  Pt states that she had one episode of double vision and blurry vision in both eys while she was watching TV at about 8 PM. The episode lasted for about 5 minutes, then resolved spontaneously. She does not have vision loss or hearing loss. Denies unilateral weakness, numbness or tingliness in extremities. Patient has newly diagnosed hypertension 3 days ago. She was started with amlodipine yesterday. She also had one episode of dizziness which was short lasting. She had one episode of her right shoulder pain and palpitation, but denies chest pain, shortness breath, cough, fever or chills. She has nausea, no vomiting, diarrhea or abdominal pain. Denies symptoms of UTI.  ED Course: pt was found to have negative d-dimer, negative troponin, WBC 7.9, potassium 3.3, creatinine normal, temperature normal, mild bradycardia, oxygen saturation 100% on room air. CT head is negative for acute intracranial abnormalities. Orthostatic vital sign is negative. Patient is placed on telemetry bed for observation.  Review of Systems:   General: no fevers, chills, no body weight gain. HEENT: had blurry vision, no hearing changes or sore throat Respiratory: no dyspnea, coughing, wheezing CV: no chest pain, no palpitations GI: has nausea, no vomiting, abdominal pain, diarrhea, constipation GU: no dysuria, burning on urination, increased urinary frequency, hematuria  Ext: no leg edema Neuro: no unilateral weakness, numbness, or tingling, no  hearing loss. Had blurry vision and double vision. Skin: no rash, no skin tear. MSK: No muscle spasm, no deformity, no limitation of range of movement in spin Heme: No easy bruising.  Travel history: No recent long distant travel.  Allergy: No Known Allergies  Past Medical History:  Diagnosis Date  . Hypertension   . Hypothyroidism     Past Surgical History:  Procedure Laterality Date  . DENTAL SURGERY      Social History:  reports that she quit smoking about 2 years ago. Her smoking use included Cigarettes. She smoked 0.25 packs per day. She has never used smokeless tobacco. She reports that she does not drink alcohol or use drugs.  Family History:  Family History  Problem Relation Age of Onset  . Heart attack Mother   . Heart attack Father   . Hypertension Father   . Hypertension Sister      Prior to Admission medications   Medication Sig Start Date End Date Taking? Authorizing Provider  amLODipine (NORVASC) 5 MG tablet Take 5 mg by mouth daily.   Yes [provider]  levothyroxine (SYNTHROID, LEVOTHROID) 125 MCG tablet Take 125 mcg by mouth daily before breakfast.   Yes [provider]    Physical Exam: Vitals:   10/06/17 0000 10/06/17 0101 10/06/17 0200 10/06/17 0330  BP: (!) 172/87 (!) 173/92 (!) 156/85 (!) 174/82  Pulse: (!) 56 (!) 49 (!) 50 (!) 52  Resp: 15 10 15 16   Temp:    98.6 F (37 C)  TempSrc:    Oral  SpO2: 98% 98% 99% 99%  Weight:    61.9 kg (136 lb 6.4 oz)  Height:  5\' 2"  (1.575 m)   General: Not in acute distress HEENT:       Eyes: PERRL, EOMI, no scleral icterus.       ENT: No discharge from the ears and nose, no pharynx injection, no tonsillar enlargement.        Neck: No JVD, no bruit, no mass felt. Heme: No neck lymph node enlargement. Cardiac: S1/S2, RRR, No murmurs, No gallops or rubs. Respiratory: No rales, wheezing, rhonchi or rubs. GI: Soft, nondistended, nontender, no rebound pain, no organomegaly, BS  present. GU: No hematuria Ext: No pitting leg edema bilaterally. 2+DP/PT pulse bilaterally. Musculoskeletal: No joint deformities, No joint redness or warmth, no limitation of ROM in spin. Skin: No rashes.  Neuro: Alert, oriented X3, cranial nerves II-XII grossly intact, moves all extremities normally. Muscle strength 5/5 in all extremities, sensation to light touch intact. Brachial reflex 2+ bilaterally. Negative Babinski's sign.  Psych: Patient is not psychotic, no suicidal or hemocidal ideation.  Labs on Admission: I have personally reviewed following labs and imaging studies  CBC:  Recent Labs Lab 10/05/17 2025  WBC 7.9  NEUTROABS 5.3  HGB 14.1  HCT 41.2  MCV 92.2  PLT 287   Basic Metabolic Panel:  Recent Labs Lab 10/05/17 2025  NA 139  K 3.3*  CL 105  CO2 25  GLUCOSE 100*  BUN 12  CREATININE 0.70  CALCIUM 9.0   GFR: Estimated Creatinine Clearance: 66.3 mL/min (by C-G formula based on SCr of 0.7 mg/dL). Liver Function Tests:  Recent Labs Lab 10/05/17 2025  AST 19  ALT 11*  ALKPHOS 100  BILITOT 0.6  PROT 7.4  ALBUMIN 4.3   No results for input(s): LIPASE, AMYLASE in the last 168 hours. No results for input(s): AMMONIA in the last 168 hours. Coagulation Profile: No results for input(s): INR, PROTIME in the last 168 hours. Cardiac Enzymes:  Recent Labs Lab 10/05/17 2025  TROPONINI <0.03   BNP (last 3 results) No results for input(s): PROBNP in the last 8760 hours. HbA1C: No results for input(s): HGBA1C in the last 72 hours. CBG: No results for input(s): GLUCAP in the last 168 hours. Lipid Profile: No results for input(s): CHOL, HDL, LDLCALC, TRIG, CHOLHDL, LDLDIRECT in the last 72 hours. Thyroid Function Tests: No results for input(s): TSH, T4TOTAL, FREET4, T3FREE, THYROIDAB in the last 72 hours. Anemia Panel: No results for input(s): VITAMINB12, FOLATE, FERRITIN, TIBC, IRON, RETICCTPCT in the last 72 hours. Urine analysis: No results found  for: COLORURINE, APPEARANCEUR, LABSPEC, PHURINE, GLUCOSEU, HGBUR, BILIRUBINUR, KETONESUR, PROTEINUR, UROBILINOGEN, NITRITE, LEUKOCYTESUR Sepsis Labs: @LABRCNTIP (procalcitonin:4,lacticidven:4) )No results found for this or any previous visit (from the past 240 hour(s)).   Radiological Exams on Admission: Ct Head Wo Contrast  Result Date: 10/05/2017 CLINICAL DATA:  61 y/o  F; nausea and vision changes. EXAM: CT HEAD WITHOUT CONTRAST TECHNIQUE: Contiguous axial images were obtained from the base of the skull through the vertex without intravenous contrast. COMPARISON:  None. FINDINGS: Brain: No evidence of acute infarction, hemorrhage, hydrocephalus, extra-axial collection or mass lesion/mass effect. Vascular: Calcific atherosclerosis of carotid siphons. Skull: Normal. Negative for fracture or focal lesion. Sinuses/Orbits: Left maxillary sinus mucous retention cyst. Otherwise negative. Other: None. IMPRESSION: No acute intracranial abnormality identified. Unremarkable CT of the head. Electronically Signed   By: Mitzi Hansen M.D.   On: 10/05/2017 22:35     EKG: Independently reviewed. Sinus rhythm, QTC 376, T-wave flattening.  Assessment/Plan Principal Problem:   Blurry vision Active Problems:   TIA (transient ischemic  attack)   Hypertension   Hypothyroidism   Hypokalemia   Blurry vision and double vision: Etiology is not clear. Patient had negative orthostatic vital sign in ED. Differential diagnoses include TIA and demyelinating disease. CT head is negative for acute intracranial abnormalities. D-dimer is negative.  - will place to tele bed for obs - Risk factor modification: HgbA1c, fasting lipid panel - MRI, MRA of the brain without contrast  - PT consult, OT consult - Bedside swallowing screen was ordered - 2 d Echocardiogram  - Carotid dopplers  - Aspirin - check UDS - pt/ot  Hypertension: -continue amlodipine -IV hydralazine when necessary  Hypokalemia: K= 3,3  on admission. - Repleted - Check Mg level  Hypothyroidism: Last TSH was not available -Continue home Synthroid -Check TSH   DVT ppx: SQ Lovenox Code Status: Full code Family Communication: None at bed side.  Disposition Plan:  Anticipate discharge back to previous home environment Consults called:  none Admission status: Obs / tele     Date of Service 10/06/2017    Lorretta HarpIU, Estalee Mccandlish [facility] Pager [phone]  If 7PM-7AM, please contact night-coverage [email or web address] Password [email or web address] 10/06/2017, 4:00 AM

## 2017-10-06 NOTE — Progress Notes (Signed)
  Echocardiogram 2D Echocardiogram has been performed.  Alexa SavoyCasey Moore Alexa Moore 10/06/2017, 1:41 PM

## 2017-10-07 LAB — T3, FREE: T3 FREE: 2.1 pg/mL (ref 2.0–4.4)

## 2017-10-07 NOTE — Care Management Note (Signed)
Case Management Note  Patient Details  Name: Alexa Moore MRN: [id number] Date of Birth: 1956-01-16  Subjective/Objective:                    Action/Plan: Pt discharged home with self care. No f/u per PT. Pt has insurance and transportation home.   Expected Discharge Date:  10/06/17               Expected Discharge Plan:  Home/Self Care  In-House Referral:     Discharge planning Services     Post Acute Care Choice:    Choice offered to:     DME Arranged:    DME Agency:     HH Arranged:    HH Agency:     Status of Service:  Completed, signed off  If discussed at [employer] Length of Stay Meetings, dates discussed:    Additional Comments:  Kermit BaloKelli F Marilin Kofman, RN 10/07/2017, 12:40 PM

## 2020-01-09 DIAGNOSIS — U071 COVID-19: Secondary | ICD-10-CM

## 2020-01-09 HISTORY — DX: COVID-19: U07.1

## 2020-01-24 ENCOUNTER — Ambulatory Visit: Payer: 59 | Attending: Internal Medicine

## 2020-01-24 DIAGNOSIS — Z20822 Contact with and (suspected) exposure to covid-19: Secondary | ICD-10-CM

## 2020-01-25 ENCOUNTER — Telehealth: Payer: Self-pay | Admitting: Physician Assistant

## 2020-01-25 ENCOUNTER — Other Ambulatory Visit: Payer: Self-pay | Admitting: Physician Assistant

## 2020-01-25 DIAGNOSIS — G459 Transient cerebral ischemic attack, unspecified: Secondary | ICD-10-CM

## 2020-01-25 DIAGNOSIS — U071 COVID-19: Secondary | ICD-10-CM

## 2020-01-25 DIAGNOSIS — I10 Essential (primary) hypertension: Secondary | ICD-10-CM

## 2020-01-25 LAB — NOVEL CORONAVIRUS, NAA: SARS-CoV-2, NAA: DETECTED — AB

## 2020-01-25 NOTE — Telephone Encounter (Signed)
Called to discuss with Brain Hilts about Covid symptoms and the use of bamlanivimab or casirivimab/imdevimab, a monoclonal antibody infusion for those with mild to moderate Covid symptoms and at a high risk of hospitalization.     Pt is qualified for this infusion at the [facility] infusion center due to co-morbid conditions and/or a member of an at-risk group, however declines infusion at this time. Symptoms tier reviewed as well as criteria for ending isolation.  Symptoms reviewed that would warrant ED/Hospital evaluation. Preventative practices reviewed. Patient verbalized understanding. Patient advised to call back if he decides that he does want to get infusion. Callback number to the infusion center given. Patient advised to go to Urgent care or ED with severe symptoms. Last date pt would be eligible for infusion is 02/02/20. I spoke to her husband who is also positive and they would like to think about it. I have sent them both a MyChart message to review and a number to call back if they want to sign up.   Patient Active Problem List   Diagnosis Date Noted  . TIA (transient ischemic attack) 10/05/2017  . Blurry vision 10/05/2017  . Hypokalemia 10/05/2017  . Hypertension   . Hypothyroidism     Cline Crock PA-C

## 2020-01-25 NOTE — Progress Notes (Signed)
  I connected by phone with Alexa Moore on 01/25/2020 at 8:40 PM to discuss the potential use of an new treatment for mild to moderate COVID-19 viral infection in non-hospitalized patients.  This patient is a 64 y.o. female that meets the FDA criteria for Emergency Use Authorization of bamlanivimab or casirivimab\imdevimab.  Has a (+) direct SARS-CoV-2 viral test result  Has mild or moderate COVID-19   Is ? 62 years of age and weighs ? 40 kg  Is NOT hospitalized due to COVID-19  Is NOT requiring oxygen therapy or requiring an increase in baseline oxygen flow rate due to COVID-19  Is within 10 days of symptom onset  Has at least one of the high risk factor(s) for progression to severe COVID-19 and/or hospitalization as defined in EUA.  Specific high risk criteria : Cardiovascular Disease and HTN    I have spoken and communicated the following to the patient or parent/caregiver:  1. FDA has authorized the emergency use of bamlanivimab and casirivimab\imdevimab for the treatment of mild to moderate COVID-19 in adults and pediatric patients with positive results of direct SARS-CoV-2 viral testing who are 24 years of age and older weighing at least 40 kg, and who are at high risk for progressing to severe COVID-19 and/or hospitalization.  2. The significant known and potential risks and benefits of bamlanivimab and casirivimab\imdevimab, and the extent to which such potential risks and benefits are unknown.  3. Information on available alternative treatments and the risks and benefits of those alternatives, including clinical trials.  4. Patients treated with bamlanivimab and casirivimab\imdevimab should continue to self-isolate and use infection control measures (e.g., wear mask, isolate, social distance, avoid sharing personal items, clean and disinfect "high touch" surfaces, and frequent handwashing) according to CDC guidelines.   5. The patient or parent/caregiver has the option to  accept or refuse bamlanivimab or casirivimab\imdevimab .  After reviewing this information with the patient, The patient agreed to proceed with receiving the bamlanimivab infusion and will be provided a copy of the Fact sheet prior to receiving the infusion.   Sx onset 2/15. Set up for infusion tomorrow 2/18 @ 2:30pm. Directions given.   Cline Crock PA-C 01/25/2020 8:40 PM

## 2020-01-26 ENCOUNTER — Ambulatory Visit (HOSPITAL_COMMUNITY)
Admission: RE | Admit: 2020-01-26 | Discharge: 2020-01-26 | Disposition: A | Discharge status: Home / Self Care | Payer: 59 | Source: Ambulatory Visit | Attending: Pulmonary Disease | Admitting: Pulmonary Disease

## 2020-01-26 DIAGNOSIS — I10 Essential (primary) hypertension: Secondary | ICD-10-CM | POA: Diagnosis present

## 2020-01-26 DIAGNOSIS — U071 COVID-19: Secondary | ICD-10-CM | POA: Insufficient documentation

## 2020-01-26 DIAGNOSIS — G459 Transient cerebral ischemic attack, unspecified: Secondary | ICD-10-CM | POA: Insufficient documentation

## 2020-01-26 MED ORDER — SODIUM CHLORIDE 0.9 % IV SOLN
700.0000 mg | Freq: Once | INTRAVENOUS | Status: AC
  Administered 2020-01-26: 700 mg via INTRAVENOUS
  Filled 2020-01-26: qty 20

## 2020-01-26 MED ORDER — SODIUM CHLORIDE 0.9 % IV SOLN
INTRAVENOUS | Status: DC | PRN
  Administered 2020-01-26: 14:00:00 250 mL via INTRAVENOUS

## 2020-01-26 MED ORDER — DIPHENHYDRAMINE HCL 50 MG/ML IJ SOLN: 50.0000 mg | Freq: Once | INTRAMUSCULAR | Status: DC | PRN

## 2020-01-26 MED ORDER — FAMOTIDINE IN NACL 20-0.9 MG/50ML-% IV SOLN: 20.0000 mg | Freq: Once | INTRAVENOUS | Status: DC | PRN

## 2020-01-26 MED ORDER — EPINEPHRINE 0.3 MG/0.3ML IJ SOAJ: 0.3000 mg | Freq: Once | INTRAMUSCULAR | Status: DC | PRN

## 2020-01-26 MED ORDER — METHYLPREDNISOLONE SODIUM SUCC 125 MG IJ SOLR: 125.0000 mg | Freq: Once | INTRAMUSCULAR | Status: DC | PRN

## 2020-01-26 MED ORDER — ALBUTEROL SULFATE HFA 108 (90 BASE) MCG/ACT IN AERS: 2.0000 | INHALATION_SPRAY | Freq: Once | RESPIRATORY_TRACT | Status: DC | PRN

## 2020-01-26 NOTE — Discharge Instructions (Signed)

## 2020-01-26 NOTE — Progress Notes (Signed)
  Diagnosis: COVID-19  Physician: DR. Delford Field   Procedure: [facility] Infusion Clinic Med: bamlanivimab infusion - Provided patient with bamlanimivab fact sheet for patients, parents and caregivers prior to infusion.  Complications: No immediate complications noted.  Discharge: Discharged home   Alexa Moore 01/26/2020

## 2020-02-03 ENCOUNTER — Ambulatory Visit: Payer: 59 | Attending: Internal Medicine

## 2020-02-03 DIAGNOSIS — Z20822 Contact with and (suspected) exposure to covid-19: Secondary | ICD-10-CM

## 2020-02-04 LAB — NOVEL CORONAVIRUS, NAA: SARS-CoV-2, NAA: DETECTED — AB

## 2020-02-07 ENCOUNTER — Ambulatory Visit: Payer: 59 | Attending: Internal Medicine

## 2020-02-07 DIAGNOSIS — Z20822 Contact with and (suspected) exposure to covid-19: Secondary | ICD-10-CM

## 2020-02-08 LAB — NOVEL CORONAVIRUS, NAA: SARS-CoV-2, NAA: DETECTED — AB

## 2020-02-13 ENCOUNTER — Ambulatory Visit: Payer: 59 | Attending: Internal Medicine

## 2020-02-13 DIAGNOSIS — Z20822 Contact with and (suspected) exposure to covid-19: Secondary | ICD-10-CM

## 2020-02-14 LAB — NOVEL CORONAVIRUS, NAA: SARS-CoV-2, NAA: NOT DETECTED

## 2020-03-15 ENCOUNTER — Telehealth: Payer: Self-pay | Admitting: General Practice

## 2020-03-15 NOTE — Telephone Encounter (Signed)
Appt. Done Thanks.

## 2020-03-15 NOTE — Telephone Encounter (Signed)
Alexa Moore Wife would like to become new patient of yours. Michelle Piper stated he already spoke to you about his wife and you approved. Please Advise.

## 2020-04-01 NOTE — Patient Instructions (Addendum)
It was very nice to meet you today!  I will be in touch with your labs asap Please stop by the lab and then you can see about doing a mammogram or scheduling for later  I will refill your thyroid med once we get your TSH level- would like to make sure no changes need to be made  We will request your records from [facility] for you

## 2020-04-01 NOTE — Progress Notes (Addendum)
[facility] [address] [place], [place] [address] [phone]  Date:  04/04/2020   Name:  Alexa Moore   DOB:  [birth year]   MRN:  [id number]  PCP:  Pearline Cables, MD    Chief Complaint: New Patient (Initial Visit)   History of Present Illness:  Alexa Moore is a 64 y.o. 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  They have 3 children, they are all well. They are 67,67,20. No grands as of yet  Amlodipine- ran out a few weeks ago  Simvastatin Aspirin Levothyroxine- she has a few days left  She had COVID-19 this past February, was treated with monoclonal antibody infusion  Per discharge summary in 2018: 1-TIA Vs malignant hypertension;  Patient present with blurry vision, double vision for 5 minutes. Also she had nausea and feeling foggy that day.  MRI negative for stroke.  Discussed with neurology, BP controlled. Continue with aspirin.  LDL 138, started on cholesterol medications.  ECHO normal. Carotid doppler, normal/  HbA1c 5.2 2-HTN; Norvasc increase to 10 mg daily.  3-hyperlipidemia; started on statins.  4-hypothyroidism; TSH elevated. Free t 3 pending. She forget to takes her synthroid on regular bases.  Continue with synthroid.   Pap: Patient has as per GYN, up-to-date per her report Mammo: needs  Colon: this is UTD Last labs: due, she is fasting today   She does not do a lot of formal exercise but she does try to stay active, works out on occasion She quit smoking a few years ago- she smoked off and on, light smoker over the years Patient Active Problem List   Diagnosis Date Noted  . TIA  (transient ischemic attack) 10/05/2017  . Blurry vision 10/05/2017  . Hypokalemia 10/05/2017  . Hypertension   . Hypothyroidism     Past Medical History:  Diagnosis Date  . COVID-19 01/2020  . Hypertension   . Hypothyroidism     Past Surgical History:  Procedure Laterality Date  . DENTAL SURGERY      Social History   Tobacco Use  . Smoking status: Former Smoker    Packs/day: 0.25    Types: Cigarettes    Quit date: 08/07/2015    Years since quitting: 4.6  . Smokeless tobacco: Never Used  Substance Use Topics  . Alcohol use: No  . Drug use: No    Family History  Problem Relation Age of Onset  . Heart attack Mother   . Heart attack Father   . Hypertension Father   . Hypertension Sister     No Known Allergies  Medication list has been reviewed and updated.  Current Outpatient Medications on File Prior to Visit  Medication Sig Dispense Refill  . levothyroxine (SYNTHROID, LEVOTHROID) 200 MCG tablet Take 200 mcg by mouth daily.     No current facility-administered medications on file prior to visit.    Review of Systems:  As per HPI- otherwise negative.   Physical Examination: Vitals:   04/04/20 0902  BP: (!) 171/84  Pulse: 71  Resp: 16  Temp: 98.6 F (37 C)  SpO2: 97%  Vitals:   04/04/20 0902  Weight: 123 lb (55.8 kg)  Height: 5\' 2"  (1.575 m)   Body mass index is 22.5 kg/m. Ideal Body Weight: Weight in (lb) to have BMI = 25: 136.4  GEN: no acute distress.  Normal weight, petite build.  Looks well HEENT: Atraumatic, Normocephalic.   Bilateral TM wnl, oropharynx normal.  PEERL,EOMI.   Ears and Nose: No external deformity. CV: RRR, No M/G/R. No JVD. No thrill. No extra heart sounds. PULM: CTA B, no wheezes, crackles, rhonchi. No retractions. No resp. distress. No accessory muscle use. ABD: S, NT, ND. No rebound. No HSM. EXTR: No c/c/e PSYCH: Normally interactive. Conversant.    Assessment and Plan: Essential hypertension - Plan: CBC,  Comprehensive metabolic panel, amLODipine (NORVASC) 5 MG tablet, DISCONTINUED: amLODipine (NORVASC) 5 MG tablet  Hyperlipidemia, unspecified hyperlipidemia type - Plan: simvastatin (ZOCOR) 20 MG tablet, Lipid panel  Acquired hypothyroidism - Plan: TSH  Screening mammogram, encounter for - Plan: MM 3D SCREEN BREAST BILATERAL  Encounter for hepatitis C screening test for low risk patient - Plan: Hepatitis C antibody  Screening for diabetes mellitus - Plan: Hemoglobin A1c  Encounter for medical examination to establish care  Here today to establish care.  History of hypertension, hyperlipidemia, hypothyroidism Ordered mammogram Labs pending as above Restart blood pressure medication, her BP is high but she has run out of her medication She also has not been taking simvastatin, I refilled this for her today We will request her outside records from [facility] Moderate medical decision making today  This visit occurred during the SARS-CoV-2 public health emergency.  Safety protocols were in place, including screening questions prior to the visit, additional usage of staff PPE, and extensive cleaning of exam room while observing appropriate contact time as indicated for disinfecting solutions.   Refill thyroid med with TSH  Signed Lamar Blinks, MD  Received her labs, 4/30 Message to patient  Results for orders placed or performed in visit on 04/04/20  CBC  Result Value Ref Range   WBC 4.3 4.0 - 10.5 K/uL   RBC 4.26 3.87 - 5.11 Mil/uL   Platelets 258.0 150.0 - 400.0 K/uL   Hemoglobin 13.2 12.0 - 15.0 g/dL   HCT 39.4 36.0 - 46.0 %   MCV 92.7 78.0 - 100.0 fl   MCHC 33.5 30.0 - 36.0 g/dL   RDW 13.7 11.5 - 15.5 %  Comprehensive metabolic panel  Result Value Ref Range   Sodium 144 135 - 145 mEq/L   Potassium 4.5 3.5 - 5.1 mEq/L   Chloride 109 96 - 112 mEq/L   CO2 29 19 - 32 mEq/L   Glucose, Bld 100 (H) 70 - 99 mg/dL   BUN 14 6 - 23 mg/dL   Creatinine, Ser 0.72 0.40 - 1.20 mg/dL    Total Bilirubin 0.3 0.2 - 1.2 mg/dL   Alkaline Phosphatase 106 39 - 117 U/L   AST 16 0 - 37 U/L   ALT 13 0 - 35 U/L   Total Protein 6.1 6.0 - 8.3 g/dL   Albumin 4.0 3.5 - 5.2 g/dL   GFR 82.26 >60.00 mL/min   Calcium 9.5 8.4 - 10.5 mg/dL  Hemoglobin A1c  Result Value Ref Range   Hgb A1c MFr Bld 5.1 4.6 - 6.5 %  Lipid panel  Result Value Ref Range   Cholesterol 171 0 - 200 mg/dL   Triglycerides 60.0 0.0 - 149.0 mg/dL   HDL 50.20 >39.00 mg/dL   VLDL 12.0 0.0 - 40.0  mg/dL   LDL Cholesterol 924 (H) 0 - 99 mg/dL   Total CHOL/HDL Ratio 3    NonHDL 120.57   TSH  Result Value Ref Range   TSH 0.01 (L) 0.35 - 4.50 uIU/mL  Hepatitis C antibody  Result Value Ref Range   Hepatitis C Ab NON-REACTIVE NON-REACTI   SIGNAL TO CUT-OFF 0.05 <1.00   Thyroid is too high, will decrease to 150 mg with recheck in 1 month

## 2020-04-04 ENCOUNTER — Ambulatory Visit: Payer: 59 | Admitting: Family Medicine

## 2020-04-04 ENCOUNTER — Encounter: Payer: Self-pay | Admitting: Family Medicine

## 2020-04-04 ENCOUNTER — Other Ambulatory Visit: Payer: Self-pay

## 2020-04-04 VITALS — BP 171/84 | HR 71 | Temp 98.6°F | Resp 16 | Ht 62.0 in | Wt 123.0 lb

## 2020-04-04 DIAGNOSIS — Z1159 Encounter for screening for other viral diseases: Secondary | ICD-10-CM

## 2020-04-04 DIAGNOSIS — I10 Essential (primary) hypertension: Secondary | ICD-10-CM

## 2020-04-04 DIAGNOSIS — E039 Hypothyroidism, unspecified: Secondary | ICD-10-CM | POA: Diagnosis not present

## 2020-04-04 DIAGNOSIS — Z131 Encounter for screening for diabetes mellitus: Secondary | ICD-10-CM

## 2020-04-04 DIAGNOSIS — Z0000 Encounter for general adult medical examination without abnormal findings: Secondary | ICD-10-CM

## 2020-04-04 DIAGNOSIS — Z1231 Encounter for screening mammogram for malignant neoplasm of breast: Secondary | ICD-10-CM

## 2020-04-04 DIAGNOSIS — E785 Hyperlipidemia, unspecified: Secondary | ICD-10-CM

## 2020-04-04 LAB — COMPREHENSIVE METABOLIC PANEL
ALT: 13 U/L (ref 0–35)
AST: 16 U/L (ref 0–37)
Albumin: 4 g/dL (ref 3.5–5.2)
Alkaline Phosphatase: 106 U/L (ref 39–117)
BUN: 14 mg/dL (ref 6–23)
CO2: 29 mEq/L (ref 19–32)
Calcium: 9.5 mg/dL (ref 8.4–10.5)
Chloride: 109 mEq/L (ref 96–112)
Creatinine, Ser: 0.72 mg/dL (ref 0.40–1.20)
GFR: 82.26 mL/min (ref >60.00)
Glucose, Bld: 100 mg/dL — ABNORMAL HIGH (ref 70–99)
Potassium: 4.5 mEq/L (ref 3.5–5.1)
Sodium: 144 mEq/L (ref 135–145)
Total Bilirubin: 0.3 mg/dL (ref 0.2–1.2)
Total Protein: 6.1 g/dL (ref 6.0–8.3)

## 2020-04-04 LAB — TSH: TSH: 0.01 u[IU]/mL — ABNORMAL LOW (ref 0.35–4.50)

## 2020-04-04 LAB — LIPID PANEL
Cholesterol: 171 mg/dL (ref 0–200)
HDL: 50.2 mg/dL (ref >39.00)
LDL Cholesterol: 109 mg/dL — ABNORMAL HIGH (ref 0–99)
NonHDL: 120.57
Total CHOL/HDL Ratio: 3
Triglycerides: 60 mg/dL (ref 0.0–149.0)
VLDL: 12 mg/dL (ref 0.0–40.0)

## 2020-04-04 LAB — HEMOGLOBIN A1C: Hgb A1c MFr Bld: 5.1 % (ref 4.6–6.5)

## 2020-04-04 MED ORDER — AMLODIPINE BESYLATE 5 MG PO TABS: 10.0000 mg | ORAL_TABLET | Freq: Every day | ORAL | 3 refills | Status: DC

## 2020-04-04 MED ORDER — SIMVASTATIN 20 MG PO TABS: 20.0000 mg | ORAL_TABLET | Freq: Every day | ORAL | 3 refills | Status: DC

## 2020-04-05 LAB — CBC
HCT: 39.4 % (ref 36.0–46.0)
Hemoglobin: 13.2 g/dL (ref 12.0–15.0)
MCHC: 33.5 g/dL (ref 30.0–36.0)
MCV: 92.7 fl (ref 78.0–100.0)
Platelets: 258 10*3/uL (ref 150.0–400.0)
RBC: 4.26 Mil/uL (ref 3.87–5.11)
RDW: 13.7 % (ref 11.5–15.5)
WBC: 4.3 10*3/uL (ref 4.0–10.5)

## 2020-04-05 LAB — HEPATITIS C ANTIBODY
Hepatitis C Ab: NONREACTIVE
SIGNAL TO CUT-OFF: 0.05 (ref <1.00)

## 2020-04-06 ENCOUNTER — Other Ambulatory Visit: Payer: Self-pay

## 2020-04-06 ENCOUNTER — Encounter: Payer: Self-pay | Admitting: Family Medicine

## 2020-04-06 ENCOUNTER — Ambulatory Visit (HOSPITAL_BASED_OUTPATIENT_CLINIC_OR_DEPARTMENT_OTHER)
Admission: RE | Admit: 2020-04-06 | Discharge: 2020-04-06 | Disposition: A | Discharge status: Home / Self Care | Payer: 59 | Source: Ambulatory Visit | Attending: Family Medicine | Admitting: Family Medicine

## 2020-04-06 DIAGNOSIS — Z1231 Encounter for screening mammogram for malignant neoplasm of breast: Secondary | ICD-10-CM

## 2020-04-06 MED ORDER — LEVOTHYROXINE SODIUM 150 MCG PO TABS: 150.0000 ug | ORAL_TABLET | Freq: Every day | ORAL | 1 refills | Status: DC

## 2020-04-06 NOTE — Addendum Note (Signed)
Addended by: Abbe Amsterdam C on: 04/06/2020 08:40 AM   Modules accepted: Orders

## 2020-06-30 ENCOUNTER — Other Ambulatory Visit: Payer: Self-pay | Admitting: Family Medicine

## 2020-06-30 DIAGNOSIS — E039 Hypothyroidism, unspecified: Secondary | ICD-10-CM

## 2020-11-05 ENCOUNTER — Other Ambulatory Visit: Payer: Self-pay | Admitting: Family Medicine

## 2020-11-05 DIAGNOSIS — I10 Essential (primary) hypertension: Secondary | ICD-10-CM

## 2020-11-05 DIAGNOSIS — E039 Hypothyroidism, unspecified: Secondary | ICD-10-CM

## 2020-12-04 ENCOUNTER — Other Ambulatory Visit: Payer: Self-pay | Admitting: Family Medicine

## 2020-12-04 DIAGNOSIS — I10 Essential (primary) hypertension: Secondary | ICD-10-CM

## 2020-12-08 ENCOUNTER — Other Ambulatory Visit: Payer: Self-pay | Admitting: Family Medicine

## 2020-12-08 DIAGNOSIS — E039 Hypothyroidism, unspecified: Secondary | ICD-10-CM

## 2020-12-11 ENCOUNTER — Encounter: Payer: Self-pay | Admitting: Family Medicine

## 2021-01-04 ENCOUNTER — Other Ambulatory Visit: Payer: Self-pay | Admitting: Family Medicine

## 2021-01-04 DIAGNOSIS — I10 Essential (primary) hypertension: Secondary | ICD-10-CM

## 2021-01-18 NOTE — Patient Instructions (Addendum)
Good to see you again today!  I will be in touch with your labs asap Pap done today I will refill you thyroid med once your TSH comes back just in case we need to make an adjustment  Please check with your GI doctor and make sure your colon cancer screening is up to date  Assuming all is well we can plan to visit in 6 months    Health Maintenance, Female Adopting a healthy lifestyle and getting preventive care are important in promoting health and wellness. Ask your health care provider about:  The right schedule for you to have regular tests and exams.  Things you can do on your own to prevent diseases and keep yourself healthy. What should I know about diet, weight, and exercise? Eat a healthy diet  Eat a diet that includes plenty of vegetables, fruits, low-fat dairy products, and lean protein.  Do not eat a lot of foods that are high in solid fats, added sugars, or sodium.   Maintain a healthy weight Body mass index (BMI) is used to identify weight problems. It estimates body fat based on height and weight. Your health care provider can help determine your BMI and help you achieve or maintain a healthy weight. Get regular exercise Get regular exercise. This is one of the most important things you can do for your health. Most adults should:  Exercise for at least 150 minutes each week. The exercise should increase your heart rate and make you sweat (moderate-intensity exercise).  Do strengthening exercises at least twice a week. This is in addition to the moderate-intensity exercise.  Spend less time sitting. Even light physical activity can be beneficial. Watch cholesterol and blood lipids Have your blood tested for lipids and cholesterol at 63 years of age, then have this test every 5 years. Have your cholesterol levels checked more often if:  Your lipid or cholesterol levels are high.  You are older than 62 years of age.  You are at high risk for heart disease. What should  I know about cancer screening? Depending on your health history and family history, you may need to have cancer screening at various ages. This may include screening for:  Breast cancer.  Cervical cancer.  Colorectal cancer.  Skin cancer.  Lung cancer. What should I know about heart disease, diabetes, and high blood pressure? Blood pressure and heart disease  High blood pressure causes heart disease and increases the risk of stroke. This is more likely to develop in people who have high blood pressure readings, are of African descent, or are overweight.  Have your blood pressure checked: ? Every 3-5 years if you are 97-86 years of age. ? Every year if you are 59 years old or older. Diabetes Have regular diabetes screenings. This checks your fasting blood sugar level. Have the screening done:  Once every three years after age 72 if you are at a normal weight and have a low risk for diabetes.  More often and at a younger age if you are overweight or have a high risk for diabetes. What should I know about preventing infection? Hepatitis B If you have a higher risk for hepatitis B, you should be screened for this virus. Talk with your health care provider to find out if you are at risk for hepatitis B infection. Hepatitis C Testing is recommended for:  Everyone born from [birth year] through 1963.  Anyone with known risk factors for hepatitis C. Sexually transmitted infections (STIs)  Get  screened for STIs, including gonorrhea and chlamydia, if: ? You are sexually active and are younger than 63 years of age. ? You are older than 63 years of age and your health care provider tells you that you are at risk for this type of infection. ? Your sexual activity has changed since you were last screened, and you are at increased risk for chlamydia or gonorrhea. Ask your health care provider if you are at risk.  Ask your health care provider about whether you are at high risk for HIV. Your health  care provider may recommend a prescription medicine to help prevent HIV infection. If you choose to take medicine to prevent HIV, you should first get tested for HIV. You should then be tested every 3 months for as long as you are taking the medicine. Pregnancy  If you are about to stop having your period (premenopausal) and you may become pregnant, seek counseling before you get pregnant.  Take 400 to 800 micrograms (mcg) of folic acid every day if you become pregnant.  Ask for birth control (contraception) if you want to prevent pregnancy. Osteoporosis and menopause Osteoporosis is a disease in which the bones lose minerals and strength with aging. This can result in bone fractures. If you are 39 years old or older, or if you are at risk for osteoporosis and fractures, ask your health care provider if you should:  Be screened for bone loss.  Take a calcium or vitamin D supplement to lower your risk of fractures.  Be given hormone replacement therapy (HRT) to treat symptoms of menopause. Follow these instructions at home: Lifestyle  Do not use any products that contain nicotine or tobacco, such as cigarettes, e-cigarettes, and chewing tobacco. If you need help quitting, ask your health care provider.  Do not use street drugs.  Do not share needles.  Ask your health care provider for help if you need support or information about quitting drugs. Alcohol use  Do not drink alcohol if: ? Your health care provider tells you not to drink. ? You are pregnant, may be pregnant, or are planning to become pregnant.  If you drink alcohol: ? Limit how much you use to 0-1 drink a day. ? Limit intake if you are breastfeeding.  Be aware of how much alcohol is in your drink. In the [place] one drink equals one 12 oz bottle of beer (355 mL), one 5 oz glass of wine (148 mL), or one 1 oz glass of hard liquor (44 mL). General instructions  Schedule regular health, dental, and eye exams.  Stay  current with your vaccines.  Tell your health care provider if: ? You often feel depressed. ? You have ever been abused or do not feel safe at home. Summary  Adopting a healthy lifestyle and getting preventive care are important in promoting health and wellness.  Follow your health care provider's instructions about healthy diet, exercising, and getting tested or screened for diseases.  Follow your health care provider's instructions on monitoring your cholesterol and blood pressure. This information is not intended to replace advice given to you by your health care provider. Make sure you discuss any questions you have with your health care provider. Document Revised: 11/17/2018 Document Reviewed: 11/17/2018 Elsevier Patient Education  2021 [employer].

## 2021-01-18 NOTE — Progress Notes (Addendum)
[facility] [address], [address] [place], [place] [address] [phone]  Date:  01/21/2021   Name:  Alexa Moore   DOB:  1956/11/03   MRN:  [id number]  PCP:  Pearline Cables, MD    Chief Complaint: Medication Refill   History of Present Illness:  Alexa Moore is a 65 y.o. very pleasant female patient who presents with the following:  Medication discussion today Last seen by myself in April  History of TIA in 2018, hypertension, hypothyroidism She works for a Facilities manager, admin side  She is from [place] originally, but has lived in this area for 30 years  She is married to Alexa Moore.  They have 3 children all in their 30s. No grands as of yet  covid series- she did a J and J dose- encouraged her to get a booster.  She did have covid in December Tetanus- will give a booster today Flu today Pap- last done approx 5 years ago.  She did have an abnormal in her youth, all ok in recent years.  Update today  Colon cancer screening- pt does have GI care, she is not quite sure but she does have a GI doctor in [place] She will check on this   Last tsh was low- we adjusted thyroid replacement and will recheck today   Amlodipine Levothyroxine Simvastatin- she has not been taking, just forgets.  She is willing to use this again No PMB  Lab Results  Component Value Date   TSH 0.01 (L) 04/04/2020     Patient Active Problem List   Diagnosis Date Noted  . TIA (transient ischemic attack) 10/05/2017  . Blurry vision 10/05/2017  . Hypokalemia 10/05/2017  . Hypertension   . Hypothyroidism     Past Medical History:  Diagnosis Date  . COVID-19 01/2020  . Hypertension   . Hypothyroidism     Past Surgical History:  Procedure Laterality Date  . DENTAL SURGERY      Social History   Tobacco Use  . Smoking status: Former Smoker    Packs/day: 0.25    Types: Cigarettes    Quit date: 08/07/2015    Years since quitting: 5.4  .  Smokeless tobacco: Never Used  Substance Use Topics  . Alcohol use: No  . Drug use: No    Family History  Problem Relation Age of Onset  . Heart attack Mother   . Heart attack Father   . Hypertension Father   . Hypertension Sister     No Known Allergies  Medication list has been reviewed and updated.  Current Outpatient Medications on File Prior to Visit  Medication Sig Dispense Refill  . levothyroxine (SYNTHROID) 150 MCG tablet Take 1 tablet (150 mcg total) by mouth daily before breakfast. 30 tablet 0   No current facility-administered medications on file prior to visit.    Review of Systems:  As per HPI- otherwise negative.   Physical Examination: Vitals:   01/21/21 1039  BP: (!) 144/82  Pulse: (!) 56  Resp: 16  SpO2: 99%   Vitals:   01/21/21 1039  Weight: 132 lb (59.9 kg)  Height: 5\' 2"  (1.575 m)   Body mass index is 24.14 kg/m. Ideal Body Weight: Weight in (lb) to have BMI = 25: 136.4  GEN: no acute distress. Normal weight, looks well  HEENT: Atraumatic, Normocephalic.  Bilateral TM wnl,PEERL,EOMI.   Ears and Nose: No external deformity. CV: RRR, No M/G/R.  No JVD. No thrill. No extra heart sounds. PULM: CTA B, no wheezes, crackles, rhonchi. No retractions. No resp. distress. No accessory muscle use. ABD: S, NT, ND. No rebound. No HSM. EXTR: No c/c/e PSYCH: Normally interactive. Conversant.  Normal vulva and vagina, normal cervix on visual exam    BP Readings from Last 3 Encounters:  01/21/21 (!) 144/82  04/04/20 (!) 171/84  01/26/20 128/71    Assessment and Plan: Acquired hypothyroidism - Plan: TSH  Essential hypertension - Plan: CBC, Comprehensive metabolic panel, amLODipine (NORVASC) 10 MG tablet  Hyperlipidemia, unspecified hyperlipidemia type - Plan: Lipid panel, simvastatin (ZOCOR) 20 MG tablet  Screening for diabetes mellitus - Plan: Hemoglobin A1c  Fatigue, unspecified type - Plan: TSH, VITAMIN D 25 Hydroxy (Vit-D Deficiency,  Fractures)  Immunization due - Plan: Tdap vaccine greater than or equal to 7yo IM  Screening for cervical cancer - Plan: Cytology - PAP  Following up today- Will plan further follow- up pending labs. Her BP is slightly elevated- asked her to check on this 2-3x a week over the next month and update me.  Can adjust medication if needed She may get some minor LE edema on amlodipine 10 but not enough to bother her Pap done Tdap, flu today   Refill thyroid med once TSH comes in  This visit occurred during the SARS-CoV-2 public health emergency.  Safety protocols were in place, including screening questions prior to the visit, additional usage of staff PPE, and extensive cleaning of exam room while observing appropriate contact time as indicated for disinfecting solutions.    Signed Abbe Amsterdam, MD  Received her labs as below, message to pt  Results for orders placed or performed in visit on 01/21/21  CBC  Result Value Ref Range   WBC 5.2 4.0 - 10.5 K/uL   RBC 4.62 3.87 - 5.11 Mil/uL   Platelets 328.0 150.0 - 400.0 K/uL   Hemoglobin 14.6 12.0 - 15.0 g/dL   HCT 73.4 28.7 - 68.1 %   MCV 91.6 78.0 - 100.0 fl   MCHC 34.6 30.0 - 36.0 g/dL   RDW 15.7 26.2 - 03.5 %  Comprehensive metabolic panel  Result Value Ref Range   Sodium 143 135 - 145 mEq/L   Potassium 4.4 3.5 - 5.1 mEq/L   Chloride 106 96 - 112 mEq/L   CO2 28 19 - 32 mEq/L   Glucose, Bld 94 70 - 99 mg/dL   BUN 14 6 - 23 mg/dL   Creatinine, Ser 5.97 0.40 - 1.20 mg/dL   Total Bilirubin 0.3 0.2 - 1.2 mg/dL   Alkaline Phosphatase 130 (H) 39 - 117 U/L   AST 13 0 - 37 U/L   ALT 12 0 - 35 U/L   Total Protein 7.0 6.0 - 8.3 g/dL   Albumin 4.3 3.5 - 5.2 g/dL   GFR 41.63 >84.53 mL/min   Calcium 10.0 8.4 - 10.5 mg/dL  Hemoglobin M4W  Result Value Ref Range   Hgb A1c MFr Bld 5.2 4.6 - 6.5 %  Lipid panel  Result Value Ref Range   Cholesterol 185 0 - 200 mg/dL   Triglycerides 80.3 0.0 - 149.0 mg/dL   HDL 21.22 >48.25 mg/dL    VLDL 00.3 0.0 - 70.4 mg/dL   LDL Cholesterol 888 (H) 0 - 99 mg/dL   Total CHOL/HDL Ratio 3    NonHDL 129.54   TSH  Result Value Ref Range   TSH 0.03 (L) 0.35 - 4.50 uIU/mL  VITAMIN D 25 Hydroxy (  Vit-D Deficiency, Fractures)  Result Value Ref Range   VITD 22.86 (L) 30.00 - 100.00 ng/mL

## 2021-01-21 ENCOUNTER — Encounter: Payer: Self-pay | Admitting: Family Medicine

## 2021-01-21 ENCOUNTER — Ambulatory Visit: Payer: 59 | Admitting: Family Medicine

## 2021-01-21 ENCOUNTER — Other Ambulatory Visit (HOSPITAL_COMMUNITY)
Admission: RE | Admit: 2021-01-21 | Discharge: 2021-01-21 | Disposition: A | Discharge status: Home / Self Care | Payer: 59 | Source: Ambulatory Visit | Attending: Family Medicine | Admitting: Family Medicine

## 2021-01-21 ENCOUNTER — Other Ambulatory Visit: Payer: Self-pay

## 2021-01-21 VITALS — BP 144/82 | HR 56 | Resp 16 | Ht 62.0 in | Wt 132.0 lb

## 2021-01-21 DIAGNOSIS — E559 Vitamin D deficiency, unspecified: Secondary | ICD-10-CM

## 2021-01-21 DIAGNOSIS — E039 Hypothyroidism, unspecified: Secondary | ICD-10-CM | POA: Diagnosis not present

## 2021-01-21 DIAGNOSIS — Z23 Encounter for immunization: Secondary | ICD-10-CM | POA: Diagnosis not present

## 2021-01-21 DIAGNOSIS — Z124 Encounter for screening for malignant neoplasm of cervix: Secondary | ICD-10-CM

## 2021-01-21 DIAGNOSIS — R5383 Other fatigue: Secondary | ICD-10-CM | POA: Diagnosis not present

## 2021-01-21 DIAGNOSIS — I10 Essential (primary) hypertension: Secondary | ICD-10-CM | POA: Diagnosis not present

## 2021-01-21 DIAGNOSIS — R748 Abnormal levels of other serum enzymes: Secondary | ICD-10-CM

## 2021-01-21 DIAGNOSIS — Z131 Encounter for screening for diabetes mellitus: Secondary | ICD-10-CM

## 2021-01-21 DIAGNOSIS — E785 Hyperlipidemia, unspecified: Secondary | ICD-10-CM | POA: Diagnosis not present

## 2021-01-21 LAB — COMPREHENSIVE METABOLIC PANEL
ALT: 12 U/L (ref 0–35)
AST: 13 U/L (ref 0–37)
Albumin: 4.3 g/dL (ref 3.5–5.2)
Alkaline Phosphatase: 130 U/L — ABNORMAL HIGH (ref 39–117)
BUN: 14 mg/dL (ref 6–23)
CO2: 28 mEq/L (ref 19–32)
Calcium: 10 mg/dL (ref 8.4–10.5)
Chloride: 106 mEq/L (ref 96–112)
Creatinine, Ser: 0.74 mg/dL (ref 0.40–1.20)
GFR: 86.89 mL/min (ref >60.00)
Glucose, Bld: 94 mg/dL (ref 70–99)
Potassium: 4.4 mEq/L (ref 3.5–5.1)
Sodium: 143 mEq/L (ref 135–145)
Total Bilirubin: 0.3 mg/dL (ref 0.2–1.2)
Total Protein: 7 g/dL (ref 6.0–8.3)

## 2021-01-21 LAB — LIPID PANEL
Cholesterol: 185 mg/dL (ref 0–200)
HDL: 55.6 mg/dL (ref >39.00)
LDL Cholesterol: 113 mg/dL — ABNORMAL HIGH (ref 0–99)
NonHDL: 129.54
Total CHOL/HDL Ratio: 3
Triglycerides: 83 mg/dL (ref 0.0–149.0)
VLDL: 16.6 mg/dL (ref 0.0–40.0)

## 2021-01-21 LAB — CBC
HCT: 42.3 % (ref 36.0–46.0)
Hemoglobin: 14.6 g/dL (ref 12.0–15.0)
MCHC: 34.6 g/dL (ref 30.0–36.0)
MCV: 91.6 fl (ref 78.0–100.0)
Platelets: 328 10*3/uL (ref 150.0–400.0)
RBC: 4.62 Mil/uL (ref 3.87–5.11)
RDW: 12.2 % (ref 11.5–15.5)
WBC: 5.2 10*3/uL (ref 4.0–10.5)

## 2021-01-21 LAB — VITAMIN D 25 HYDROXY (VIT D DEFICIENCY, FRACTURES): VITD: 22.86 ng/mL — ABNORMAL LOW (ref 30.00–100.00)

## 2021-01-21 LAB — TSH: TSH: 0.03 u[IU]/mL — ABNORMAL LOW (ref 0.35–4.50)

## 2021-01-21 LAB — HEMOGLOBIN A1C: Hgb A1c MFr Bld: 5.2 % (ref 4.6–6.5)

## 2021-01-21 MED ORDER — SIMVASTATIN 20 MG PO TABS: 20.0000 mg | ORAL_TABLET | Freq: Every day | ORAL | 3 refills | Status: AC

## 2021-01-21 MED ORDER — AMLODIPINE BESYLATE 10 MG PO TABS: 10.0000 mg | ORAL_TABLET | Freq: Every day | ORAL | 3 refills | Status: DC

## 2021-01-22 ENCOUNTER — Encounter: Payer: Self-pay | Admitting: Family Medicine

## 2021-01-22 LAB — CYTOLOGY - PAP
Comment: NEGATIVE
Diagnosis: NEGATIVE
High risk HPV: NEGATIVE

## 2021-01-22 MED ORDER — VITAMIN D3 1.25 MG (50000 UT) PO CAPS: ORAL_CAPSULE | ORAL | 0 refills | Status: DC

## 2021-01-22 MED ORDER — LEVOTHYROXINE SODIUM 125 MCG PO TABS: 125.0000 ug | ORAL_TABLET | Freq: Every day | ORAL | 3 refills | Status: DC

## 2021-01-22 NOTE — Addendum Note (Signed)
Addended by: Abbe Amsterdam C on: 01/22/2021 12:58 PM   Modules accepted: Orders

## 2021-05-29 ENCOUNTER — Other Ambulatory Visit: Payer: Self-pay | Admitting: Family Medicine

## 2021-05-29 DIAGNOSIS — E039 Hypothyroidism, unspecified: Secondary | ICD-10-CM

## 2021-07-15 NOTE — Progress Notes (Addendum)
[name] [address] [place], [place] [address] [phone]  Date:  07/22/2021   Name:  Alexa Moore   DOB:  07/12/1956   MRN:  [id number]  PCP:  Alexa Mclean, MD    Chief Complaint: Follow-up (6 month) and Edema (Ankles and feet , believes its a bp medication side effect)   History of Present Illness:  Alexa Moore is a 66 y.o. very pleasant female patient who presents with the following:  Patient seen today for 55-monthfollow-up visit Most recent visit with myself was [place] her TSH is quite suppressed, I made a levothyroxine dose change.  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 I encouraged her to do this   She does get some ankle swelling intermittently - she may notice it more han 50% of days. This is worse when it is hot out  She wonders if this may be due to her amlodipine, certainly this may be the culprit.  We will try changing her to a different medication  She may also feel lightheaded if she stands up quickly  She does check her BP at home any may notice readings of 140/80s on average   No chest pain or palpitations Weight is stable, she had an echo in 2018 which showed normal EF  Wt Readings from Last 3 Encounters:  07/22/21 128 lb 9.6 oz (58.3 kg)  01/21/21 132 lb (59.9 kg)  04/04/20 123 lb  (55.8 kg)     Lab Results  Component Value Date   TSH 0.03 (L) 01/21/2021     Patient Active Problem List   Diagnosis Date Noted   TIA (transient ischemic attack) 10/05/2017   Blurry vision 10/05/2017   Hypokalemia 10/05/2017   Hypertension    Hypothyroidism     Past Medical History:  Diagnosis Date   COVID-19 01/2020   Hypertension    Hypothyroidism     Past Surgical History:  Procedure Laterality Date   DENTAL SURGERY      Social History   Tobacco Use   Smoking status: Former    Packs/day: 0.25    Types: Cigarettes    Quit date: 08/07/2015    Years since quitting: 5.9   Smokeless tobacco: Never  Substance Use Topics   Alcohol use: No   Drug use: No    Family History  Problem Relation Age of Onset   Heart attack Mother    Heart attack Father    Hypertension Father    Hypertension Sister     No Known Allergies  Medication list has been reviewed and updated.  Current Outpatient Medications on File Prior to Visit  Medication Sig Dispense Refill   Cholecalciferol (VITAMIN D3) 1.25 MG (50000 UT) CAPS Take 1 weekly for 12 weeks 12 capsule  0   levothyroxine (SYNTHROID) 125 MCG tablet TAKE 1 TABLET BY MOUTH DAILY BEFORE BREAKFAST. 30 tablet 0   simvastatin (ZOCOR) 20 MG tablet Take 1 tablet (20 mg total) by mouth daily at 6 PM. 90 tablet 3   No current facility-administered medications on file prior to visit.    Review of Systems:  As per HPI- otherwise negative.   Physical Examination: Vitals:   07/22/21 0904  BP: 130/64  Pulse: 67  Resp: 18  Temp: 97.8 F (36.6 C)  SpO2: 100%   Vitals:   07/22/21 0904  Weight: 128 lb 9.6 oz (58.3 kg)  Height: 5' 2"  (1.575 m)   Body mass index is 23.52 kg/m. Ideal Body Weight: Weight in (lb) to have BMI = 25: 136.4  GEN: no acute distress. Normal weight, looks well  HEENT: Atraumatic, Normocephalic.  Ears and Nose: No external deformity. CV: RRR, No M/G/R. No JVD. No thrill. No extra heart  sounds. PULM: CTA B, no wheezes, crackles, rhonchi. No retractions. No resp. distress. No accessory muscle use. ABD: S, NT, ND, +BS. No rebound. No HSM. EXTR: No c/c/e PSYCH: Normally interactive. Conversant.  No ankle edema at this time  Assessment and Plan: Elevated alkaline phosphatase level - Plan: Comprehensive metabolic panel  Vitamin D deficiency - Plan: VITAMIN D 25 Hydroxy (Vit-D Deficiency, Fractures)  Acquired hypothyroidism - Plan: TSH  Hyperlipidemia, unspecified hyperlipidemia type  Essential hypertension - Plan: losartan (COZAAR) 50 MG tablet  Lower extremity edema  Patient seen today for follow-up.  She notes lower extremity edema which may be related to her amlodipine.  We will have her change her amlodipine to losartan 50-I gave her instructions for making this change and monitoring blood pressure after change  Encouraged needed immunizations  Will plan further follow- up pending labs.  This visit occurred during the SARS-CoV-2 public health emergency.  Safety protocols were in place, including screening questions prior to the visit, additional usage of staff PPE, and extensive cleaning of exam room while observing appropriate contact time as indicated for disinfecting solutions.   Signed Alexa Blinks, MD   Received labs as below, message to patient  Results for orders placed or performed in visit on 07/22/21  TSH  Result Value Ref Range   TSH 0.21 (L) 0.35 - 5.50 uIU/mL  Comprehensive metabolic panel  Result Value Ref Range   Sodium 142 135 - 145 mEq/L   Potassium 4.2 3.5 - 5.1 mEq/L   Chloride 105 96 - 112 mEq/L   CO2 28 19 - 32 mEq/L   Glucose, Bld 95 70 - 99 mg/dL   BUN 16 6 - 23 mg/dL   Creatinine, Ser 0.79 0.40 - 1.20 mg/dL   Total Bilirubin 0.4 0.2 - 1.2 mg/dL   Alkaline Phosphatase 116 39 - 117 U/L   AST 14 0 - 37 U/L   ALT 10 0 - 35 U/L   Total Protein 6.7 6.0 - 8.3 g/dL   Albumin 4.5 3.5 - 5.2 g/dL   GFR 80.05 >60.00 mL/min    Calcium 9.9 8.4 - 10.5 mg/dL  VITAMIN D 25 Hydroxy (Vit-D Deficiency, Fractures)  Result Value Ref Range   VITD 35.83 30.00 - 100.00 ng/mL

## 2021-07-18 ENCOUNTER — Other Ambulatory Visit: Payer: Self-pay | Admitting: Family Medicine

## 2021-07-18 DIAGNOSIS — E039 Hypothyroidism, unspecified: Secondary | ICD-10-CM

## 2021-07-22 ENCOUNTER — Encounter: Payer: Self-pay | Admitting: Family Medicine

## 2021-07-22 ENCOUNTER — Other Ambulatory Visit: Payer: Self-pay

## 2021-07-22 ENCOUNTER — Ambulatory Visit: Payer: BC Managed Care – PPO | Admitting: Family Medicine

## 2021-07-22 VITALS — BP 130/64 | HR 67 | Temp 97.8°F | Resp 18 | Ht 62.0 in | Wt 128.6 lb

## 2021-07-22 DIAGNOSIS — E039 Hypothyroidism, unspecified: Secondary | ICD-10-CM

## 2021-07-22 DIAGNOSIS — R748 Abnormal levels of other serum enzymes: Secondary | ICD-10-CM | POA: Diagnosis not present

## 2021-07-22 DIAGNOSIS — E785 Hyperlipidemia, unspecified: Secondary | ICD-10-CM | POA: Diagnosis not present

## 2021-07-22 DIAGNOSIS — I10 Essential (primary) hypertension: Secondary | ICD-10-CM

## 2021-07-22 DIAGNOSIS — R600 Localized edema: Secondary | ICD-10-CM

## 2021-07-22 DIAGNOSIS — E559 Vitamin D deficiency, unspecified: Secondary | ICD-10-CM | POA: Diagnosis not present

## 2021-07-22 LAB — COMPREHENSIVE METABOLIC PANEL
ALT: 10 U/L (ref 0–35)
AST: 14 U/L (ref 0–37)
Albumin: 4.5 g/dL (ref 3.5–5.2)
Alkaline Phosphatase: 116 U/L (ref 39–117)
BUN: 16 mg/dL (ref 6–23)
CO2: 28 mEq/L (ref 19–32)
Calcium: 9.9 mg/dL (ref 8.4–10.5)
Chloride: 105 mEq/L (ref 96–112)
Creatinine, Ser: 0.79 mg/dL (ref 0.40–1.20)
GFR: 80.05 mL/min (ref >60.00)
Glucose, Bld: 95 mg/dL (ref 70–99)
Potassium: 4.2 mEq/L (ref 3.5–5.1)
Sodium: 142 mEq/L (ref 135–145)
Total Bilirubin: 0.4 mg/dL (ref 0.2–1.2)
Total Protein: 6.7 g/dL (ref 6.0–8.3)

## 2021-07-22 LAB — VITAMIN D 25 HYDROXY (VIT D DEFICIENCY, FRACTURES): VITD: 35.83 ng/mL (ref 30.00–100.00)

## 2021-07-22 LAB — TSH: TSH: 0.21 u[IU]/mL — ABNORMAL LOW (ref 0.35–5.50)

## 2021-07-22 MED ORDER — LEVOTHYROXINE SODIUM 100 MCG PO TABS: 100.0000 ug | ORAL_TABLET | Freq: Every day | ORAL | 3 refills | Status: DC

## 2021-07-22 MED ORDER — LOSARTAN POTASSIUM 50 MG PO TABS: 50.0000 mg | ORAL_TABLET | Freq: Every day | ORAL | 3 refills | Status: DC

## 2021-07-22 NOTE — Patient Instructions (Signed)
It was good to see you again today I will be in touch with your labs asap I would encourage you to get a covid 19 booster and also the shingles vaccine (shingrix) at your convenience  Let's transition you from amlodipine to losartan Take a 1/2 amlodipine and 1/2 losartan daily for 2-4 days .  Then stop amlodipine and take a whole losartan 50 mg Let me know how your BP looks with this transition.  If too high or too low we can make an adjustment  Also let me know if the swelling or lightheaded feeling does not resolve

## 2021-07-22 NOTE — Addendum Note (Signed)
Addended by: Abbe Amsterdam C on: 07/22/2021 03:17 PM   Modules accepted: Orders

## 2021-08-19 ENCOUNTER — Other Ambulatory Visit: Payer: Self-pay | Admitting: Family Medicine

## 2021-08-19 DIAGNOSIS — E039 Hypothyroidism, unspecified: Secondary | ICD-10-CM

## 2021-10-11 IMAGING — MG DIGITAL SCREENING BILAT W/ TOMO W/ CAD
8 series · 8 of 24 positions shown · non-contrast
Comparison: Previous exam(s).

CLINICAL DATA: Screening.

EXAM:
DIGITAL SCREENING BILATERAL MAMMOGRAM WITH TOMO AND CAD

[L CC synth-2D]
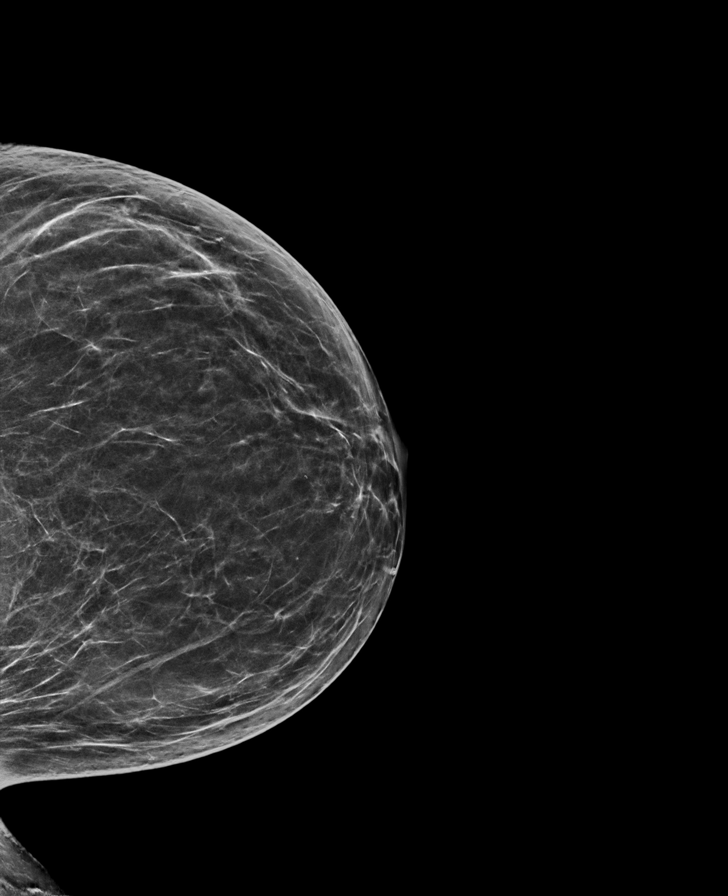

[L MLO synth-2D]
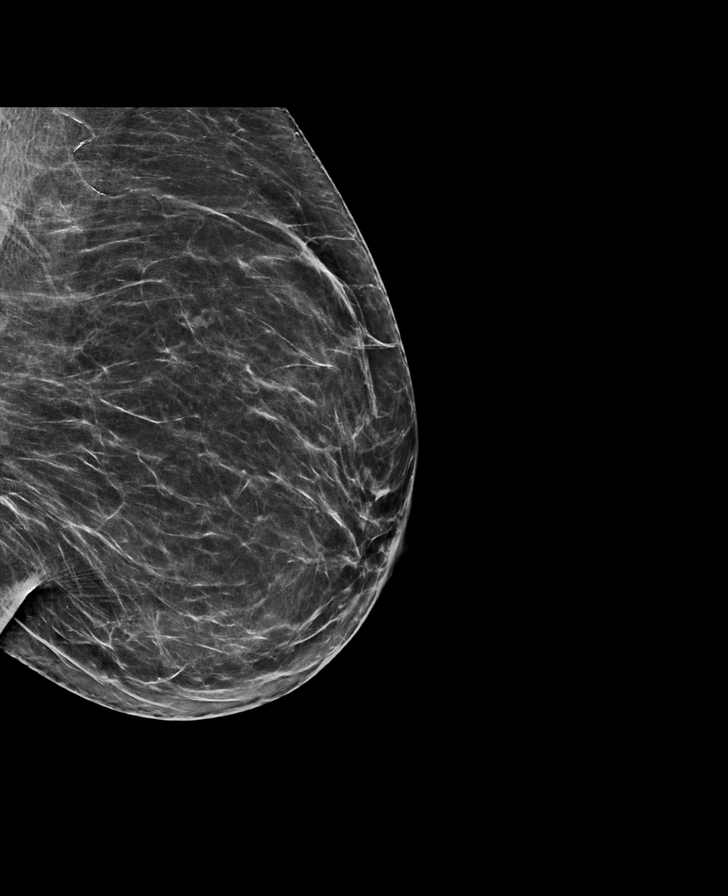

[R CC synth-2D]
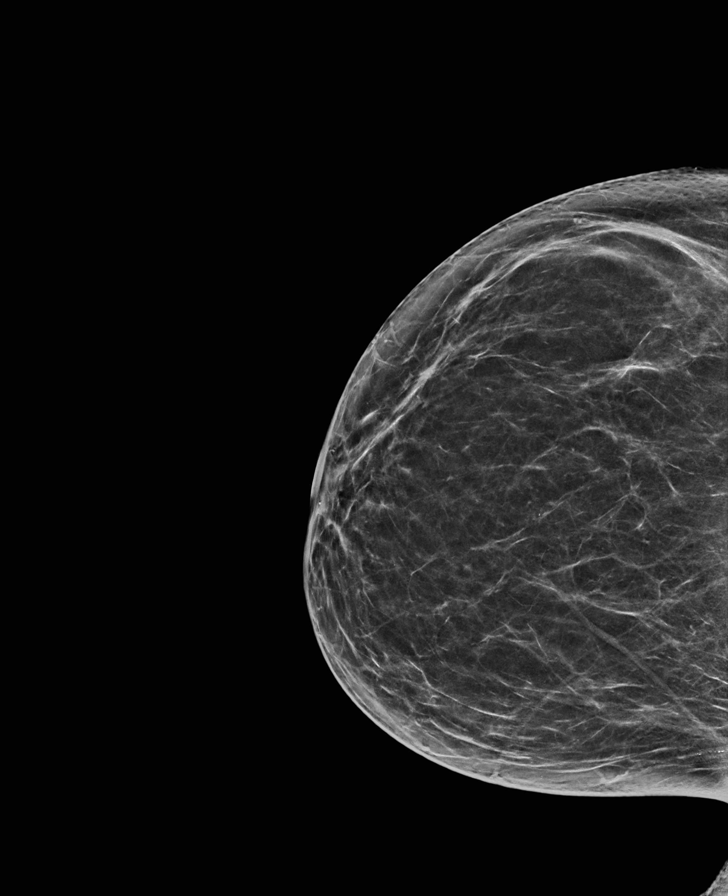

[R MLO synth-2D]
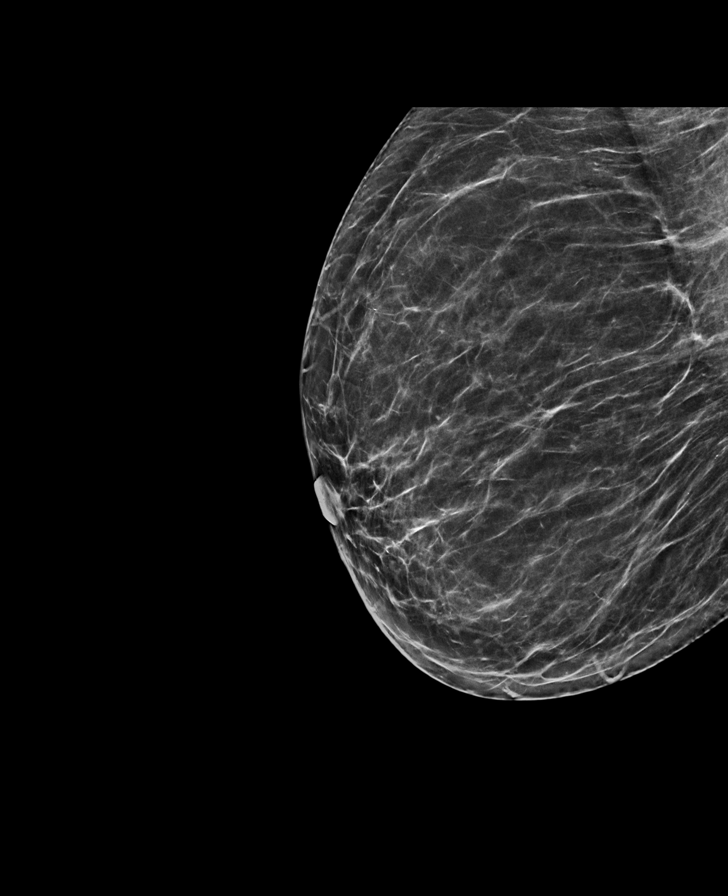

[L CC tomo · tomo slice 33/65.0]
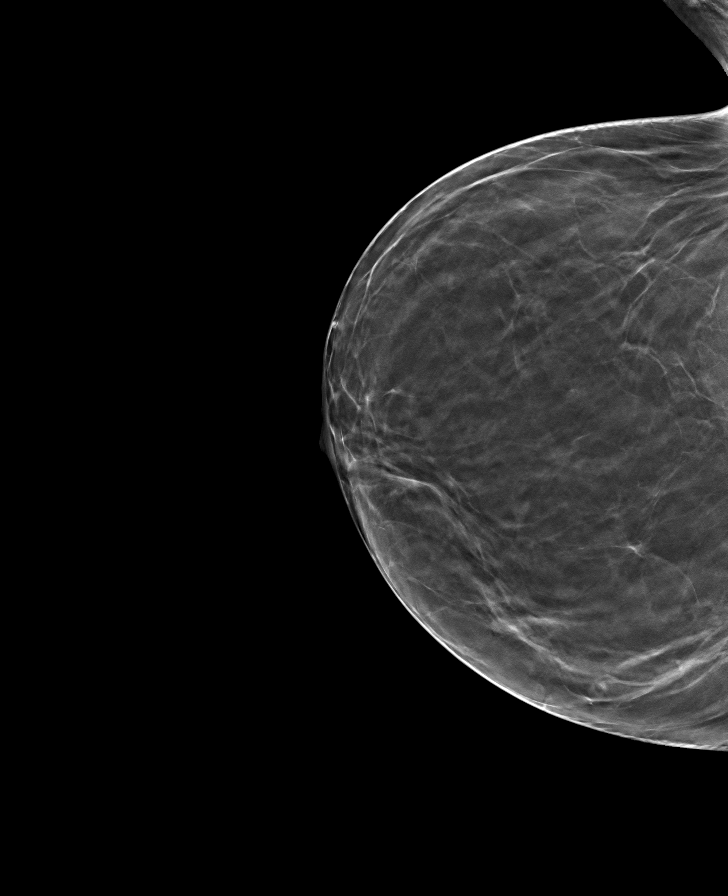

[L MLO tomo · tomo slice 35/68.0]
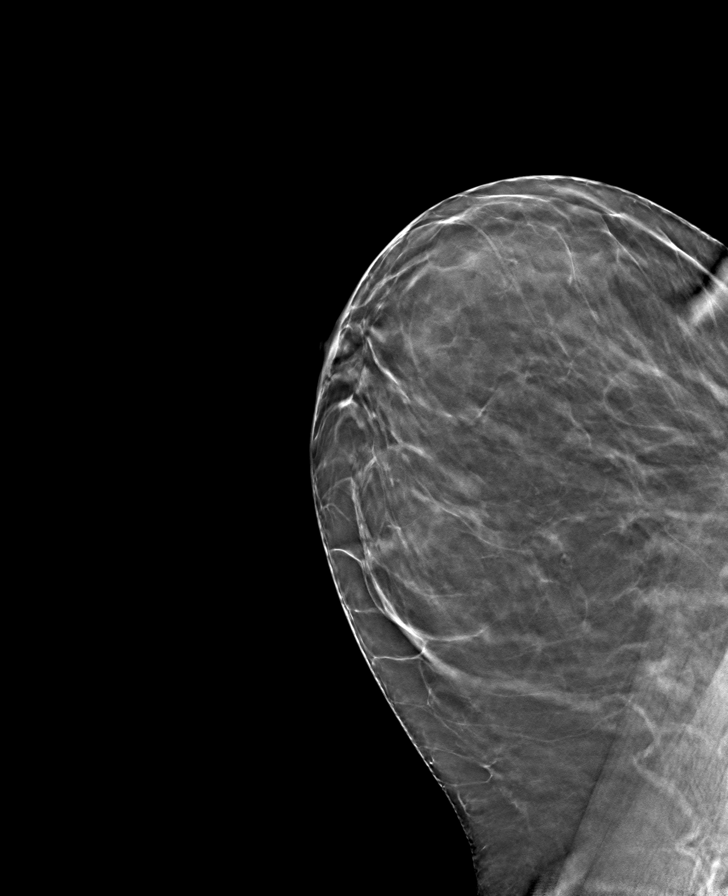

[R MLO tomo · tomo slice 35/68.0]
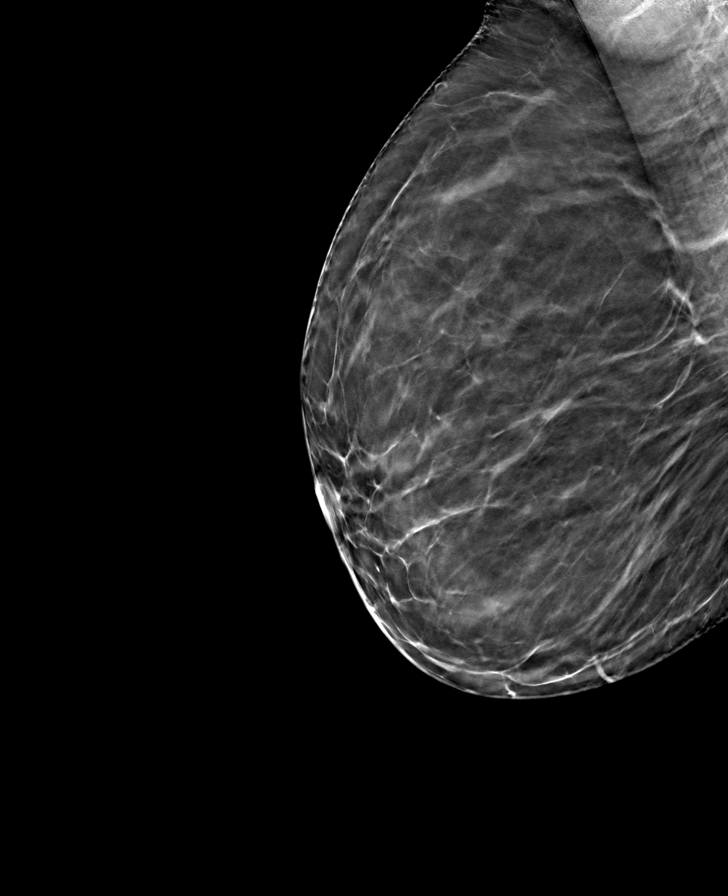

[R CC tomo · tomo slice 34/67.0]
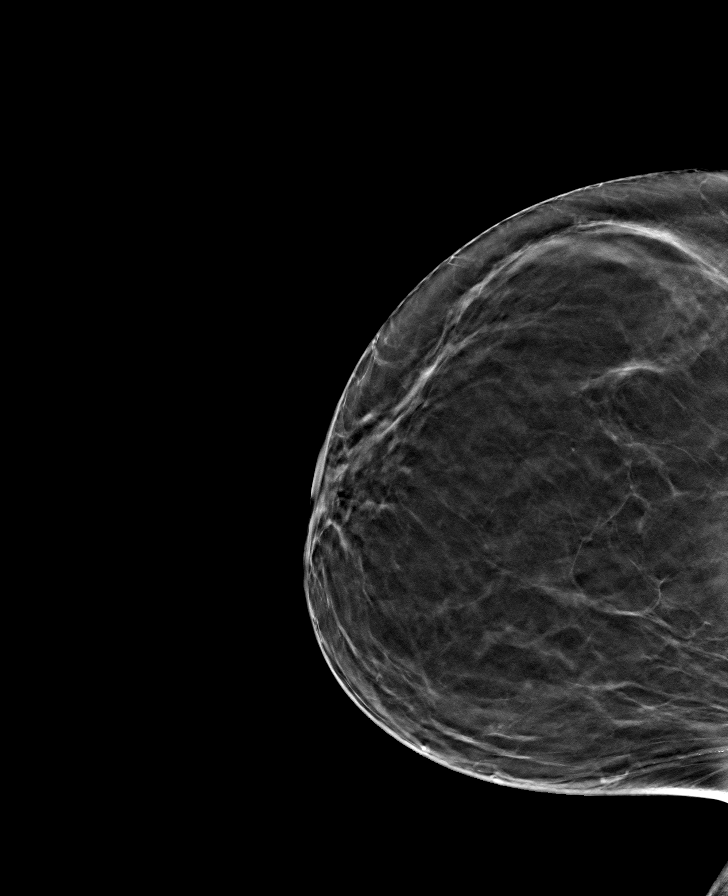

[8 of 24 positions shown; findings below may reference images not displayed]

ACR Breast Density Category b: There are scattered areas of
fibroglandular density.
FINDINGS: There are no findings suspicious for malignancy. Images were
processed with CAD.
IMPRESSION: No mammographic evidence of malignancy. A result letter of this
screening mammogram will be mailed directly to the patient.

RECOMMENDATION:
Screening mammogram in one year. (Code:[id number]-775)

BI-RADS CATEGORY  1: Negative.

## 2021-10-18 ENCOUNTER — Other Ambulatory Visit: Payer: Self-pay | Admitting: Family Medicine

## 2021-10-18 DIAGNOSIS — I10 Essential (primary) hypertension: Secondary | ICD-10-CM

## 2021-11-20 ENCOUNTER — Other Ambulatory Visit: Payer: Self-pay | Admitting: Family Medicine

## 2021-11-20 DIAGNOSIS — E039 Hypothyroidism, unspecified: Secondary | ICD-10-CM

## 2022-01-24 DIAGNOSIS — K634 Enteroptosis: Secondary | ICD-10-CM | POA: Diagnosis not present

## 2022-01-24 DIAGNOSIS — Z1211 Encounter for screening for malignant neoplasm of colon: Secondary | ICD-10-CM | POA: Diagnosis not present

## 2022-01-24 DIAGNOSIS — K621 Rectal polyp: Secondary | ICD-10-CM | POA: Diagnosis not present

## 2022-01-24 DIAGNOSIS — K640 First degree hemorrhoids: Secondary | ICD-10-CM | POA: Diagnosis not present

## 2022-01-24 DIAGNOSIS — K644 Residual hemorrhoidal skin tags: Secondary | ICD-10-CM | POA: Diagnosis not present

## 2022-01-24 DIAGNOSIS — K5730 Diverticulosis of large intestine without perforation or abscess without bleeding: Secondary | ICD-10-CM | POA: Diagnosis not present

## 2022-01-24 DIAGNOSIS — Z86010 Personal history of colonic polyps: Secondary | ICD-10-CM | POA: Diagnosis not present

## 2022-01-24 DIAGNOSIS — K635 Polyp of colon: Secondary | ICD-10-CM | POA: Diagnosis not present

## 2022-01-24 LAB — HM COLONOSCOPY

## 2022-05-24 ENCOUNTER — Other Ambulatory Visit: Payer: Self-pay | Admitting: Family Medicine

## 2022-05-24 DIAGNOSIS — E039 Hypothyroidism, unspecified: Secondary | ICD-10-CM

## 2022-05-24 DIAGNOSIS — I10 Essential (primary) hypertension: Secondary | ICD-10-CM

## 2022-12-07 ENCOUNTER — Other Ambulatory Visit: Payer: Self-pay | Admitting: Family Medicine

## 2022-12-07 DIAGNOSIS — E039 Hypothyroidism, unspecified: Secondary | ICD-10-CM

## 2022-12-07 DIAGNOSIS — I10 Essential (primary) hypertension: Secondary | ICD-10-CM

## 2023-06-24 ENCOUNTER — Other Ambulatory Visit: Payer: Self-pay | Admitting: Family Medicine

## 2023-06-24 DIAGNOSIS — I10 Essential (primary) hypertension: Secondary | ICD-10-CM

## 2023-06-24 DIAGNOSIS — E039 Hypothyroidism, unspecified: Secondary | ICD-10-CM

## 2023-07-12 NOTE — Progress Notes (Unsigned)
[facility] at [facility] [address] [place], [place] [address] [phone]  Date:  07/15/2023   Name:  Alexa Moore   DOB:  September 02, 1956   MRN:  [id number]  PCP:  Pearline Cables, MD    Chief Complaint: No chief complaint on file.   History of Present Illness:  Alexa Moore is a 67 y.o. very pleasant female patient who presents with the following:  Patient seen today for physical exam Most recent visit with myself about 2 years ago, August 2022 History of TIA in 2018, hypertension, hypothyroidism She works for a Facilities manager, admin side She is from [place] originally, but has lived in this area for 30 years She is married to Alexa Moore.  They have 3 children all in their 30s. No grands as of yet  I previously noted elevated alkaline phosphatase, but this returned to normal at most recent lab work.  Her last TSH level 2 years ago was suppressed at 0.21  She saw gastroenterology, Dr.Rhoton for colonoscopy 01/25/12  Mammogram Shingrix Patient Active Problem List   Diagnosis Date Noted   TIA (transient ischemic attack) 10/05/2017   Blurry vision 10/05/2017   Hypokalemia 10/05/2017   Hypertension    Hypothyroidism     Past Medical History:  Diagnosis Date   COVID-19 01/2020   Hypertension    Hypothyroidism     Past Surgical History:  Procedure Laterality Date   DENTAL SURGERY      Social History   Tobacco Use   Smoking status: Former    Current packs/day: 0.00    Types: Cigarettes    Quit date: 08/07/2015    Years since quitting: 7.9   Smokeless tobacco: Never  Substance Use Topics   Alcohol use: No   Drug use: No    Family History  Problem Relation Age of Onset   Heart attack Mother    Heart attack Father    Hypertension Father    Hypertension Sister     No Known Allergies  Medication list has been reviewed and updated.  Current Outpatient Medications on File Prior to Visit  Medication Sig  Dispense Refill   Cholecalciferol (VITAMIN D3) 1.25 MG (50000 UT) CAPS Take 1 weekly for 12 weeks 12 capsule 0   levothyroxine (SYNTHROID) 100 MCG tablet TAKE 1 TABLET BY MOUTH EVERY DAY BEFORE BREAKFAST 90 tablet 1   losartan (COZAAR) 50 MG tablet TAKE 1 TABLET BY MOUTH EVERY DAY 90 tablet 1   simvastatin (ZOCOR) 20 MG tablet Take 1 tablet (20 mg total) by mouth daily at 6 PM. 90 tablet 3   No current facility-administered medications on file prior to visit.    Review of Systems:  As per HPI- otherwise negative.   Physical Examination: There were no vitals filed for this visit. There were no vitals filed for this visit. There is no height or weight on file to calculate BMI. Ideal Body Weight:    GEN: no acute distress. HEENT: Atraumatic, Normocephalic.  Ears and Nose: No external deformity. CV: RRR, No M/G/R. No JVD. No thrill. No extra heart sounds. PULM: CTA B, no wheezes, crackles, rhonchi. No retractions. No resp. distress. No accessory muscle use. ABD: S, NT, ND, +BS. No rebound. No HSM. EXTR: No c/c/e PSYCH: Normally interactive. Conversant.    Assessment and Plan: ***  Signed Abbe Amsterdam, MD

## 2023-07-15 ENCOUNTER — Ambulatory Visit (INDEPENDENT_AMBULATORY_CARE_PROVIDER_SITE_OTHER): Payer: BC Managed Care – PPO | Admitting: Family Medicine

## 2023-07-15 VITALS — BP 134/80 | HR 65 | Temp 98.0°F | Resp 18 | Ht 62.0 in | Wt 129.4 lb

## 2023-07-15 DIAGNOSIS — Z1231 Encounter for screening mammogram for malignant neoplasm of breast: Secondary | ICD-10-CM

## 2023-07-15 DIAGNOSIS — E785 Hyperlipidemia, unspecified: Secondary | ICD-10-CM | POA: Diagnosis not present

## 2023-07-15 DIAGNOSIS — R748 Abnormal levels of other serum enzymes: Secondary | ICD-10-CM | POA: Diagnosis not present

## 2023-07-15 DIAGNOSIS — Z0000 Encounter for general adult medical examination without abnormal findings: Secondary | ICD-10-CM

## 2023-07-15 DIAGNOSIS — E559 Vitamin D deficiency, unspecified: Secondary | ICD-10-CM | POA: Diagnosis not present

## 2023-07-15 DIAGNOSIS — I10 Essential (primary) hypertension: Secondary | ICD-10-CM | POA: Diagnosis not present

## 2023-07-15 DIAGNOSIS — E039 Hypothyroidism, unspecified: Secondary | ICD-10-CM | POA: Diagnosis not present

## 2023-07-15 DIAGNOSIS — Z131 Encounter for screening for diabetes mellitus: Secondary | ICD-10-CM

## 2023-07-15 NOTE — Patient Instructions (Addendum)
It was great to see you again today, congratulations on your grandson!  I will be in touch with your labs as soon as possible  Let me know when you need meds refilled  I ordered your mammogram, if you would like you can stop by imaging on the ground floor today and get this scheduled  Recommend a flu shot and COVID booster this fall, recommend shingles vaccine series at your convenience

## 2023-07-16 ENCOUNTER — Encounter: Payer: Self-pay | Admitting: Family Medicine

## 2023-07-16 MED ORDER — VITAMIN D3 1.25 MG (50000 UT) PO CAPS
ORAL_CAPSULE | ORAL | 0 refills | Status: AC
Start: 2023-07-16 — End: ?

## 2023-07-16 NOTE — Addendum Note (Signed)
Addended by: Abbe Amsterdam C on: 07/16/2023 05:46 PM   Modules accepted: Orders

## 2023-07-21 ENCOUNTER — Ambulatory Visit (HOSPITAL_BASED_OUTPATIENT_CLINIC_OR_DEPARTMENT_OTHER)
Admission: RE | Admit: 2023-07-21 | Discharge: 2023-07-21 | Disposition: A | Discharge status: Home / Self Care | Payer: BC Managed Care – PPO | Source: Ambulatory Visit | Attending: Family Medicine | Admitting: Family Medicine

## 2023-07-21 ENCOUNTER — Encounter (HOSPITAL_BASED_OUTPATIENT_CLINIC_OR_DEPARTMENT_OTHER): Payer: Self-pay

## 2023-07-21 DIAGNOSIS — Z1231 Encounter for screening mammogram for malignant neoplasm of breast: Secondary | ICD-10-CM | POA: Diagnosis not present

## 2023-07-23 ENCOUNTER — Other Ambulatory Visit: Payer: Self-pay | Admitting: Family Medicine

## 2023-07-23 DIAGNOSIS — R928 Other abnormal and inconclusive findings on diagnostic imaging of breast: Secondary | ICD-10-CM

## 2023-07-29 ENCOUNTER — Ambulatory Visit
Admission: RE | Admit: 2023-07-29 | Discharge: 2023-07-29 | Disposition: A | Discharge status: Home / Self Care | Payer: BC Managed Care – PPO | Source: Ambulatory Visit | Attending: Family Medicine | Admitting: Family Medicine

## 2023-07-29 DIAGNOSIS — R928 Other abnormal and inconclusive findings on diagnostic imaging of breast: Secondary | ICD-10-CM

## 2023-08-04 ENCOUNTER — Other Ambulatory Visit: Payer: BC Managed Care – PPO

## 2023-08-27 ENCOUNTER — Other Ambulatory Visit: Payer: Self-pay | Admitting: Family Medicine

## 2023-08-27 DIAGNOSIS — I10 Essential (primary) hypertension: Secondary | ICD-10-CM

## 2023-08-27 DIAGNOSIS — E039 Hypothyroidism, unspecified: Secondary | ICD-10-CM

## 2024-03-12 ENCOUNTER — Other Ambulatory Visit: Payer: Self-pay | Admitting: Family Medicine

## 2024-03-12 DIAGNOSIS — I10 Essential (primary) hypertension: Secondary | ICD-10-CM

## 2024-05-18 ENCOUNTER — Other Ambulatory Visit: Payer: Self-pay | Admitting: Family Medicine

## 2024-05-18 DIAGNOSIS — E039 Hypothyroidism, unspecified: Secondary | ICD-10-CM

## 2024-06-25 ENCOUNTER — Other Ambulatory Visit: Payer: Self-pay | Admitting: Family Medicine

## 2024-06-25 DIAGNOSIS — E039 Hypothyroidism, unspecified: Secondary | ICD-10-CM

## 2024-07-27 ENCOUNTER — Other Ambulatory Visit: Payer: Self-pay | Admitting: Family Medicine

## 2024-07-27 DIAGNOSIS — E039 Hypothyroidism, unspecified: Secondary | ICD-10-CM

## 2024-08-19 NOTE — Patient Instructions (Addendum)
 Good to see you again today- I will be in touch with your labs and will refill your thyroid  medication when we get your TSH level back  Flu shot today Recommend a covid booster this fall and pneumonia vaccine at your convenience  Also recommend shingrix series at your pharmacy   Ordered mammogram for you today

## 2024-08-19 NOTE — Progress Notes (Signed)
 [facility] at [facility] [address] [place], [place] [address] [phone]  Date:  08/22/2024   Name:  Alexa Moore   DOB:  04-07-1956   MRN:  [id number]  PCP:  Watt Harlene BROCKS, MD    Chief Complaint: Annual Exam   History of Present Illness:  Alexa Moore is a 68 y.o. 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  They have 3 children all in their 30s- she is taking care of her 1 yo grandson now that she is retired  Mammo can be updated - will order  Pap 2/22- negative, can update today Colon- colonoscopy 2023  Former light smoker Exercise- she is active day to day  Alcohol- none   Flu shot today Discussed prevnar- she would like to do at a later date   Levothyroxine  Losartan  Simvastatin - not taking, we will see how her cholesterol looks today.  She would be willing to go back on her cholesterol medication, she just kind of stopped taking it  BP Readings from Last 3 Encounters:  08/22/24 135/85  07/15/23 134/80  07/22/21 130/64          Patient Active Problem List   Diagnosis Date Noted   TIA (transient ischemic attack) 10/05/2017   Blurry vision 10/05/2017   Hypokalemia 10/05/2017   Hypertension    Hypothyroidism     Past Medical History:  Diagnosis Date   COVID-19 01/2020   Hypertension    Hypothyroidism     Past Surgical History:  Procedure Laterality Date   DENTAL SURGERY      Social History   Tobacco Use   Smoking status: Former    Current packs/day: 0.00    Types: Cigarettes    Quit date: 08/07/2015    Years since quitting: 9.0   Smokeless tobacco:  Never  Substance Use Topics   Alcohol use: No   Drug use: No    Family History  Problem Relation Age of Onset   Heart attack Mother    Heart attack Father    Hypertension Father    Hypertension Sister     No Known Allergies  Medication list has been reviewed and updated.  Current Outpatient Medications on File Prior to Visit  Medication Sig Dispense Refill   levothyroxine  (SYNTHROID ) 100 MCG tablet TAKE 1 TABLET (100 MCG TOTAL) BY MOUTH DAILY BEFORE BREAKFAST. NEEDS APPT 30 tablet 0   Cholecalciferol (VITAMIN D3) 1.25 MG (50000 UT) CAPS Take 1 weekly for 12 weeks (Patient not taking: Reported on 08/22/2024) 12 capsule 0   simvastatin  (ZOCOR ) 20 MG tablet Take 1 tablet (20 mg total) by mouth daily at 6 PM. (Patient not taking: Reported on 08/22/2024) 90 tablet 3   No current facility-administered medications on file prior to visit.    Review of Systems: As per HPI- otherwise negative.   Physical Examination: Vitals:   08/22/24 1542 08/22/24 1618  BP: (!) 140/100 135/85  Pulse: 67   Temp: 98 F (36.7 C)   SpO2: 99%    Vitals:   08/22/24 1542  Weight: 130 lb (59 kg)  Height:  5' 2 (1.575 m)   Body mass index is 23.78 kg/m. Ideal Body Weight: Weight in (lb) to have BMI = 25: 136.4  GEN: no acute distress. Slim build, looks well  HEENT: Atraumatic, Normocephalic.  Bilateral TM wnl, oropharynx normal.  PEERL,EOMI.   Ears and Nose: No external deformity. CV: RRR, No M/G/R. No JVD. No thrill. No extra heart sounds. PULM: CTA B, no wheezes, crackles, rhonchi. No retractions. No resp. distress. No accessory muscle use. ABD: S, NT, ND, +BS. No rebound. No HSM. EXTR: No c/c/e PSYCH: Normally interactive. Conversant.  Collected Pap today.  Normal vulva, vagina, cervix  Assessment and Plan: Physical exam  Acquired hypothyroidism - Plan: TSH  Essential hypertension - Plan: CBC, Comprehensive metabolic panel with GFR, losartan  (COZAAR ) 50 MG tablet  Hyperlipidemia,  unspecified hyperlipidemia type - Plan: Lipid panel  Vitamin D  deficiency - Plan: Vitamin D  (25 hydroxy)  Screening for diabetes mellitus - Plan: Comprehensive metabolic panel with GFR, Hemoglobin A1c  Screening for cervical cancer - Plan: Cytology - PAP( [facility])  Encounter for screening mammogram for malignant neoplasm of breast - Plan: MM 3D SCREENING MAMMOGRAM BILATERAL BREAST  Need for influenza vaccination - Plan: Flu vaccine HIGH DOSE PF(Fluzone Trivalent)  Patient seen today for physical exam.  Overall she is doing well.  Encouraged continued healthy diet and exercise routine Follow-up on labs as above Blood pressure slightly elevated, better on recheck.  I asked her to please check her blood pressure at home over the next couple of weeks and send me a message with some home readings Gave flu shot today, encourage shingles vaccine, pneumonia vaccine at her convenience Order mammogram Will need to refill her levothyroxine  monitor TSH comes in Can potentially start her back on statin depending on her lipid results Signed Harlene Schroeder, MD  Received her labs 9/16- message to pt  Results for orders placed or performed in visit on 08/22/24  CBC   Collection Time: 08/22/24  4:37 PM  Result Value Ref Range   WBC 6.7 4.0 - 10.5 K/uL   RBC 4.41 3.87 - 5.11 Mil/uL   Platelets 306.0 150.0 - 400.0 K/uL   Hemoglobin 14.1 12.0 - 15.0 g/dL   HCT 58.2 63.9 - 53.9 %   MCV 94.5 78.0 - 100.0 fl   MCHC 33.7 30.0 - 36.0 g/dL   RDW 87.2 88.4 - 84.4 %  Comprehensive metabolic panel with GFR   Collection Time: 08/22/24  4:37 PM  Result Value Ref Range   Sodium 141 135 - 145 mEq/L   Potassium 4.7 3.5 - 5.1 mEq/L   Chloride 107 96 - 112 mEq/L   CO2 26 19 - 32 mEq/L   Glucose, Bld 94 70 - 99 mg/dL   BUN 18 6 - 23 mg/dL   Creatinine, Ser 9.06 0.40 - 1.20 mg/dL   Total Bilirubin 0.3 0.2 - 1.2 mg/dL   Alkaline Phosphatase 92 39 - 117 U/L   AST 18 0 - 37 U/L   ALT 11 0 - 35 U/L    Total Protein 6.9 6.0 - 8.3 g/dL   Albumin 4.4 3.5 - 5.2 g/dL   GFR 35.58 >39.99 mL/min   Calcium 9.8 8.4 - 10.5 mg/dL  Hemoglobin J8r   Collection Time: 08/22/24  4:37 PM  Result Value Ref Range   Hgb A1c MFr Bld 5.6 4.6 - 6.5 %  Lipid panel   Collection Time: 08/22/24  4:37 PM  Result Value Ref Range   Cholesterol 214 (H)  0 - 200 mg/dL   Triglycerides 868.9 0.0 - 149.0 mg/dL   HDL 38.49 >60.99 mg/dL   VLDL 73.7 0.0 - 59.9 mg/dL   LDL Cholesterol 873 (H) 0 - 99 mg/dL   Total CHOL/HDL Ratio 3    NonHDL 152.56   TSH   Collection Time: 08/22/24  4:37 PM  Result Value Ref Range   TSH 4.99 0.35 - 5.50 uIU/mL  Vitamin D  (25 hydroxy)   Collection Time: 08/22/24  4:37 PM  Result Value Ref Range   VITD 36.48 30.00 - 100.00 ng/mL

## 2024-08-22 ENCOUNTER — Ambulatory Visit (INDEPENDENT_AMBULATORY_CARE_PROVIDER_SITE_OTHER): Admitting: Family Medicine

## 2024-08-22 ENCOUNTER — Other Ambulatory Visit (HOSPITAL_COMMUNITY)
Admission: RE | Admit: 2024-08-22 | Discharge: 2024-08-22 | Disposition: A | Discharge status: Home / Self Care | Source: Ambulatory Visit | Attending: Family Medicine | Admitting: Family Medicine

## 2024-08-22 VITALS — BP 135/85 | HR 67 | Temp 98.0°F | Ht 62.0 in | Wt 130.0 lb

## 2024-08-22 DIAGNOSIS — Z01419 Encounter for gynecological examination (general) (routine) without abnormal findings: Secondary | ICD-10-CM | POA: Insufficient documentation

## 2024-08-22 DIAGNOSIS — Z0000 Encounter for general adult medical examination without abnormal findings: Secondary | ICD-10-CM | POA: Diagnosis not present

## 2024-08-22 DIAGNOSIS — Z23 Encounter for immunization: Secondary | ICD-10-CM | POA: Diagnosis not present

## 2024-08-22 DIAGNOSIS — E785 Hyperlipidemia, unspecified: Secondary | ICD-10-CM | POA: Diagnosis not present

## 2024-08-22 DIAGNOSIS — E039 Hypothyroidism, unspecified: Secondary | ICD-10-CM

## 2024-08-22 DIAGNOSIS — Z131 Encounter for screening for diabetes mellitus: Secondary | ICD-10-CM | POA: Diagnosis not present

## 2024-08-22 DIAGNOSIS — Z1231 Encounter for screening mammogram for malignant neoplasm of breast: Secondary | ICD-10-CM

## 2024-08-22 DIAGNOSIS — I10 Essential (primary) hypertension: Secondary | ICD-10-CM | POA: Diagnosis not present

## 2024-08-22 DIAGNOSIS — E559 Vitamin D deficiency, unspecified: Secondary | ICD-10-CM | POA: Diagnosis not present

## 2024-08-22 DIAGNOSIS — Z124 Encounter for screening for malignant neoplasm of cervix: Secondary | ICD-10-CM | POA: Insufficient documentation

## 2024-08-22 DIAGNOSIS — Z1151 Encounter for screening for human papillomavirus (HPV): Secondary | ICD-10-CM | POA: Diagnosis not present

## 2024-08-22 MED ORDER — LOSARTAN POTASSIUM 50 MG PO TABS
50.0000 mg | ORAL_TABLET | Freq: Every day | ORAL | 3 refills | Status: AC
Start: 2024-08-22 — End: ?

## 2024-08-23 ENCOUNTER — Encounter: Payer: Self-pay | Admitting: Family Medicine

## 2024-08-23 LAB — LIPID PANEL
Cholesterol: 214 mg/dL — ABNORMAL HIGH (ref 0–200)
HDL: 61.5 mg/dL (ref >39.00)
LDL Cholesterol: 126 mg/dL — ABNORMAL HIGH (ref 0–99)
NonHDL: 152.56
Total CHOL/HDL Ratio: 3
Triglycerides: 131 mg/dL (ref 0.0–149.0)
VLDL: 26.2 mg/dL (ref 0.0–40.0)

## 2024-08-23 LAB — CBC
HCT: 41.7 % (ref 36.0–46.0)
Hemoglobin: 14.1 g/dL (ref 12.0–15.0)
MCHC: 33.7 g/dL (ref 30.0–36.0)
MCV: 94.5 fl (ref 78.0–100.0)
Platelets: 306 K/uL (ref 150.0–400.0)
RBC: 4.41 Mil/uL (ref 3.87–5.11)
RDW: 12.7 % (ref 11.5–15.5)
WBC: 6.7 K/uL (ref 4.0–10.5)

## 2024-08-23 LAB — HEMOGLOBIN A1C: Hgb A1c MFr Bld: 5.6 % (ref 4.6–6.5)

## 2024-08-23 LAB — TSH: TSH: 4.99 u[IU]/mL (ref 0.35–5.50)

## 2024-08-23 LAB — VITAMIN D 25 HYDROXY (VIT D DEFICIENCY, FRACTURES): VITD: 36.48 ng/mL (ref 30.00–100.00)

## 2024-08-23 MED ORDER — LEVOTHYROXINE SODIUM 100 MCG PO TABS
100.0000 ug | ORAL_TABLET | Freq: Every day | ORAL | 3 refills | Status: AC
Start: 2024-08-23 — End: ?

## 2024-08-23 NOTE — Addendum Note (Signed)
 Addended by: WATT RAISIN C on: 08/23/2024 02:46 PM   Modules accepted: Orders

## 2024-08-24 ENCOUNTER — Encounter: Payer: Self-pay | Admitting: Family Medicine

## 2024-08-24 LAB — CYTOLOGY - PAP
Comment: NEGATIVE
Diagnosis: NEGATIVE
High risk HPV: NEGATIVE

## 2024-08-27 LAB — COMPREHENSIVE METABOLIC PANEL WITH GFR
ALT: 11 U/L (ref 0–35)
AST: 18 U/L (ref 0–37)
Albumin: 4.4 g/dL (ref 3.5–5.2)
Alkaline Phosphatase: 92 U/L (ref 39–117)
BUN: 18 mg/dL (ref 6–23)
CO2: 26 meq/L (ref 19–32)
Calcium: 9.8 mg/dL (ref 8.4–10.5)
Chloride: 107 meq/L (ref 96–112)
Creatinine, Ser: 0.93 mg/dL (ref 0.40–1.20)
GFR: 64.41 mL/min (ref >60.00)
Glucose, Bld: 94 mg/dL (ref 70–99)
Potassium: 5.2 meq/L — ABNORMAL HIGH (ref 3.5–5.1)
Sodium: 141 meq/L (ref 135–145)
Total Bilirubin: 0.3 mg/dL (ref 0.2–1.2)
Total Protein: 6.9 g/dL (ref 6.0–8.3)

## 2024-09-15 ENCOUNTER — Inpatient Hospital Stay (HOSPITAL_BASED_OUTPATIENT_CLINIC_OR_DEPARTMENT_OTHER): Admission: RE | Admit: 2024-09-15

## 2024-11-07 ENCOUNTER — Telehealth: Payer: Self-pay | Admitting: Family Medicine

## 2024-11-07 NOTE — Telephone Encounter (Signed)
 Copied from CRM #[id number]. Topic: Medicare AWV >> Nov 07, 2024  9:47 AM Nathanel DEL wrote: Called LVM 11/07/2024 to sched AWV. Please schedule in office or virtual visit.   Nathanel Paschal; Care Guide Ambulatory Clinical Support [place] [facility] Direct Dial: [phone]

## 2024-11-22 ENCOUNTER — Telehealth: Payer: Self-pay

## 2024-11-22 DIAGNOSIS — I10 Essential (primary) hypertension: Secondary | ICD-10-CM

## 2024-11-22 NOTE — Progress Notes (Signed)
 Pharmacy Quality Measure Review  This patient is appearing on a report for being at risk of failing the adherence measure for hypertension (ACEi/ARB) medications this calendar year.   Medication: Losartan  50mg  Last fill date: 07/13 for 90 day supply  States she is doing well on the medication, is adherent. Sent a refill request to her pharmacy yesterday. Has no questions or issues with the medication.  Jwan Hornbaker, PharmD [facility] Pharmacist
